# Patient Record
Sex: Female | Born: 1940 | Race: Black or African American | Hispanic: No | Smoking: Former smoker
Health system: Southern US, Community
[De-identification: ages and names within clinical notes are randomized; demographics above are authoritative.]

## PROBLEM LIST (undated history)

## (undated) DIAGNOSIS — M1990 Unspecified osteoarthritis, unspecified site: Secondary | ICD-10-CM

## (undated) DIAGNOSIS — R51 Headache: Secondary | ICD-10-CM

## (undated) DIAGNOSIS — C801 Malignant (primary) neoplasm, unspecified: Secondary | ICD-10-CM

## (undated) DIAGNOSIS — F419 Anxiety disorder, unspecified: Secondary | ICD-10-CM

## (undated) DIAGNOSIS — I10 Essential (primary) hypertension: Secondary | ICD-10-CM

## (undated) DIAGNOSIS — E785 Hyperlipidemia, unspecified: Secondary | ICD-10-CM

## (undated) DIAGNOSIS — L989 Disorder of the skin and subcutaneous tissue, unspecified: Secondary | ICD-10-CM

## (undated) DIAGNOSIS — E669 Obesity, unspecified: Secondary | ICD-10-CM

## (undated) DIAGNOSIS — M489 Spondylopathy, unspecified: Secondary | ICD-10-CM

## (undated) DIAGNOSIS — N189 Chronic kidney disease, unspecified: Secondary | ICD-10-CM

## (undated) HISTORY — DX: Chronic kidney disease, unspecified: N18.9

## (undated) HISTORY — DX: Hyperlipidemia, unspecified: E78.5

## (undated) HISTORY — DX: Spondylopathy, unspecified: M48.9

## (undated) HISTORY — DX: Essential (primary) hypertension: I10

## (undated) HISTORY — PX: ABDOMINAL HYSTERECTOMY: SHX81

## (undated) HISTORY — DX: Unspecified osteoarthritis, unspecified site: M19.90

## (undated) HISTORY — DX: Malignant (primary) neoplasm, unspecified: C80.1

## (undated) HISTORY — DX: Obesity, unspecified: E66.9

---

## 2003-11-15 ENCOUNTER — Encounter: Admission: RE | Admit: 2003-11-15 | Discharge: 2004-02-13 | Payer: Self-pay | Admitting: Endocrinology

## 2003-12-30 ENCOUNTER — Ambulatory Visit: Payer: Self-pay | Admitting: Endocrinology

## 2004-01-15 ENCOUNTER — Ambulatory Visit: Payer: Self-pay | Admitting: Pulmonary Disease

## 2004-01-15 ENCOUNTER — Ambulatory Visit (HOSPITAL_BASED_OUTPATIENT_CLINIC_OR_DEPARTMENT_OTHER): Admission: RE | Admit: 2004-01-15 | Discharge: 2004-01-15 | Payer: Self-pay | Admitting: Endocrinology

## 2004-01-26 ENCOUNTER — Ambulatory Visit: Payer: Self-pay | Admitting: Endocrinology

## 2004-02-03 ENCOUNTER — Ambulatory Visit: Payer: Self-pay | Admitting: Endocrinology

## 2004-02-06 ENCOUNTER — Ambulatory Visit: Payer: Self-pay | Admitting: Endocrinology

## 2004-03-07 ENCOUNTER — Ambulatory Visit: Payer: Self-pay | Admitting: Family Medicine

## 2004-03-16 ENCOUNTER — Ambulatory Visit: Payer: Self-pay | Admitting: Endocrinology

## 2004-04-18 ENCOUNTER — Ambulatory Visit: Payer: Self-pay | Admitting: Family Medicine

## 2004-04-19 ENCOUNTER — Ambulatory Visit: Payer: Self-pay | Admitting: *Deleted

## 2004-05-10 ENCOUNTER — Ambulatory Visit: Payer: Self-pay | Admitting: Family Medicine

## 2004-05-18 ENCOUNTER — Ambulatory Visit: Payer: Self-pay | Admitting: Family Medicine

## 2004-06-04 ENCOUNTER — Ambulatory Visit: Payer: Self-pay | Admitting: Family Medicine

## 2004-06-14 ENCOUNTER — Ambulatory Visit: Payer: Self-pay | Admitting: Internal Medicine

## 2004-06-21 ENCOUNTER — Ambulatory Visit: Payer: Self-pay | Admitting: Internal Medicine

## 2005-01-10 ENCOUNTER — Ambulatory Visit: Payer: Self-pay | Admitting: Family Medicine

## 2005-01-14 ENCOUNTER — Ambulatory Visit: Payer: Self-pay | Admitting: Family Medicine

## 2005-01-31 ENCOUNTER — Ambulatory Visit: Payer: Self-pay | Admitting: Family Medicine

## 2005-02-11 ENCOUNTER — Ambulatory Visit: Payer: Self-pay | Admitting: Family Medicine

## 2005-05-01 ENCOUNTER — Ambulatory Visit: Payer: Self-pay | Admitting: Family Medicine

## 2005-05-19 ENCOUNTER — Emergency Department (HOSPITAL_COMMUNITY): Admission: EM | Admit: 2005-05-19 | Discharge: 2005-05-19 | Payer: Self-pay | Admitting: Emergency Medicine

## 2005-05-21 ENCOUNTER — Emergency Department (HOSPITAL_COMMUNITY): Admission: EM | Admit: 2005-05-21 | Discharge: 2005-05-21 | Payer: Self-pay | Admitting: Family Medicine

## 2005-06-12 ENCOUNTER — Ambulatory Visit: Payer: Self-pay | Admitting: Family Medicine

## 2005-08-22 ENCOUNTER — Ambulatory Visit: Payer: Self-pay | Admitting: Family Medicine

## 2005-09-26 ENCOUNTER — Ambulatory Visit: Payer: Self-pay | Admitting: Family Medicine

## 2005-11-26 ENCOUNTER — Ambulatory Visit: Payer: Self-pay | Admitting: Family Medicine

## 2006-01-07 ENCOUNTER — Ambulatory Visit: Payer: Self-pay | Admitting: Family Medicine

## 2006-01-29 ENCOUNTER — Ambulatory Visit: Payer: Self-pay | Admitting: Family Medicine

## 2006-02-03 ENCOUNTER — Ambulatory Visit: Payer: Self-pay | Admitting: Internal Medicine

## 2006-04-03 ENCOUNTER — Ambulatory Visit: Payer: Self-pay | Admitting: Family Medicine

## 2006-04-10 ENCOUNTER — Ambulatory Visit (HOSPITAL_COMMUNITY): Admission: RE | Admit: 2006-04-10 | Discharge: 2006-04-10 | Payer: Self-pay | Admitting: Family Medicine

## 2006-04-10 ENCOUNTER — Ambulatory Visit: Payer: Self-pay | Admitting: Family Medicine

## 2006-09-09 ENCOUNTER — Encounter (INDEPENDENT_AMBULATORY_CARE_PROVIDER_SITE_OTHER): Payer: Self-pay | Admitting: Family Medicine

## 2006-09-09 ENCOUNTER — Ambulatory Visit: Payer: Self-pay | Admitting: Family Medicine

## 2006-09-09 LAB — CONVERTED CEMR LAB: Microalbumin U total vol: 26.7 mg/L

## 2006-11-03 ENCOUNTER — Encounter (INDEPENDENT_AMBULATORY_CARE_PROVIDER_SITE_OTHER): Payer: Self-pay | Admitting: Family Medicine

## 2006-11-03 DIAGNOSIS — E119 Type 2 diabetes mellitus without complications: Secondary | ICD-10-CM | POA: Insufficient documentation

## 2006-11-03 DIAGNOSIS — Z9079 Acquired absence of other genital organ(s): Secondary | ICD-10-CM | POA: Insufficient documentation

## 2006-11-03 DIAGNOSIS — Z9889 Other specified postprocedural states: Secondary | ICD-10-CM

## 2006-11-03 DIAGNOSIS — M1990 Unspecified osteoarthritis, unspecified site: Secondary | ICD-10-CM | POA: Insufficient documentation

## 2006-11-03 DIAGNOSIS — I10 Essential (primary) hypertension: Secondary | ICD-10-CM

## 2006-11-03 DIAGNOSIS — E1159 Type 2 diabetes mellitus with other circulatory complications: Secondary | ICD-10-CM | POA: Insufficient documentation

## 2006-12-17 ENCOUNTER — Encounter (INDEPENDENT_AMBULATORY_CARE_PROVIDER_SITE_OTHER): Payer: Self-pay | Admitting: *Deleted

## 2007-11-02 ENCOUNTER — Encounter: Admission: RE | Admit: 2007-11-02 | Discharge: 2007-11-02 | Payer: Self-pay | Admitting: General Practice

## 2010-08-17 NOTE — Procedures (Signed)
Bianca Barnett, Bianca Barnett                ACCOUNT NO.:  [id number]   MEDICAL RECORD NO.:  [id number]          PATIENT TYPE:  OUT   LOCATION:  SLEEP CENTER                 FACILITY:  [facility]   PHYSICIAN:  Marcelyn Bruins, M.D. [facility] DATE OF BIRTH:  Oct 04, 1940   DATE OF STUDY:  01/15/2004                              NOCTURNAL POLYSOMNOGRAM   REFERRING PHYSICIAN:  Dr. Romero Belling.   INDICATIONS FOR SURGERY:  Hypersomnia with sleep apnea.   SLEEP ARCHITECTURE:  The patient had a total sleep time of 295 minutes with  sleep efficiency of only 79%.  There was very little REM and decreased slow  wave sleep.  The patient had a fairly normal sleep onset latency and a very  prolonged REM latency at 331 minutes.   IMPRESSION:  1.  Moderate obstructive sleep apnea/hypopnea syndrome with a respiratory      disturbance index of 34 events per hour and oxygen desaturation as low      as 78%.  Events were not positional.  2.  Loud snoring noted throughout the study.  3.  Occasional premature ventricular contraction.  4.  Moderate numbers of leg jerks with mild sleep disruption.  Clinical      correlation is suggested if the patient does not respond adequately to      treatment of her obstructive sleep apnea.      KC/MEDQ  D:  02/01/2004 12:36:44  T:  02/01/2004 15:01:51  Job:  [id number]

## 2011-01-11 ENCOUNTER — Ambulatory Visit
Admission: RE | Admit: 2011-01-11 | Discharge: 2011-01-11 | Disposition: A | Discharge status: Home / Self Care | Payer: Medicare Other | Source: Ambulatory Visit | Attending: Family Medicine | Admitting: Family Medicine

## 2011-01-11 ENCOUNTER — Other Ambulatory Visit: Payer: Self-pay | Admitting: Family Medicine

## 2011-01-11 DIAGNOSIS — R220 Localized swelling, mass and lump, head: Secondary | ICD-10-CM

## 2011-01-11 DIAGNOSIS — R52 Pain, unspecified: Secondary | ICD-10-CM

## 2011-01-28 ENCOUNTER — Emergency Department (HOSPITAL_COMMUNITY): Payer: Medicare Other

## 2011-01-28 ENCOUNTER — Emergency Department (HOSPITAL_COMMUNITY)
Admission: EM | Admit: 2011-01-28 | Discharge: 2011-01-29 | Disposition: A | Discharge status: Home / Self Care | Payer: Medicare Other | Attending: Emergency Medicine | Admitting: Emergency Medicine

## 2011-01-28 DIAGNOSIS — R002 Palpitations: Secondary | ICD-10-CM | POA: Insufficient documentation

## 2011-01-28 DIAGNOSIS — R109 Unspecified abdominal pain: Secondary | ICD-10-CM | POA: Insufficient documentation

## 2011-01-28 DIAGNOSIS — R079 Chest pain, unspecified: Secondary | ICD-10-CM | POA: Insufficient documentation

## 2011-01-28 DIAGNOSIS — M5412 Radiculopathy, cervical region: Secondary | ICD-10-CM | POA: Insufficient documentation

## 2011-01-28 DIAGNOSIS — R0602 Shortness of breath: Secondary | ICD-10-CM | POA: Insufficient documentation

## 2011-01-28 DIAGNOSIS — E119 Type 2 diabetes mellitus without complications: Secondary | ICD-10-CM | POA: Insufficient documentation

## 2011-01-28 DIAGNOSIS — Z794 Long term (current) use of insulin: Secondary | ICD-10-CM | POA: Insufficient documentation

## 2011-01-28 DIAGNOSIS — I10 Essential (primary) hypertension: Secondary | ICD-10-CM | POA: Insufficient documentation

## 2011-01-28 DIAGNOSIS — Z79899 Other long term (current) drug therapy: Secondary | ICD-10-CM | POA: Insufficient documentation

## 2011-01-28 LAB — URINALYSIS, ROUTINE W REFLEX MICROSCOPIC
Bilirubin Urine: NEGATIVE
Leukocytes, UA: NEGATIVE
Nitrite: NEGATIVE
Specific Gravity, Urine: 1.016 (ref 1.005–1.030)
Urobilinogen, UA: 0.2 mg/dL (ref 0.0–1.0)

## 2011-01-28 LAB — COMPREHENSIVE METABOLIC PANEL
AST: 15 U/L (ref 0–37)
Albumin: 3.5 g/dL (ref 3.5–5.2)
Creatinine, Ser: 0.88 mg/dL (ref 0.50–1.10)
GFR calc non Af Amer: 66 mL/min — ABNORMAL LOW (ref >90)
Sodium: 136 mEq/L (ref 135–145)
Total Bilirubin: 0.1 mg/dL — ABNORMAL LOW (ref 0.3–1.2)
Total Protein: 7.7 g/dL (ref 6.0–8.3)

## 2011-01-28 LAB — URINE MICROSCOPIC-ADD ON

## 2011-01-28 LAB — GLUCOSE, CAPILLARY: Glucose-Capillary: 182 mg/dL — ABNORMAL HIGH (ref 70–99)

## 2011-01-28 LAB — DIFFERENTIAL
Basophils Absolute: 0 10*3/uL (ref 0.0–0.1)
Basophils Relative: 0 % (ref 0–1)
Eosinophils Relative: 1 % (ref 0–5)
Monocytes Absolute: 0.5 10*3/uL (ref 0.1–1.0)
Monocytes Relative: 5 % (ref 3–12)
Neutro Abs: 5.6 10*3/uL (ref 1.7–7.7)

## 2011-01-28 LAB — CBC
HCT: 38.7 % (ref 36.0–46.0)
Hemoglobin: 12.7 g/dL (ref 12.0–15.0)
MCH: 26.8 pg (ref 26.0–34.0)
MCV: 81.6 fL (ref 78.0–100.0)
Platelets: 263 10*3/uL (ref 150–400)
RBC: 4.74 MIL/uL (ref 3.87–5.11)
WBC: 10 10*3/uL (ref 4.0–10.5)

## 2011-01-28 LAB — POCT I-STAT TROPONIN I: Troponin i, poc: 0 ng/mL (ref 0.00–0.08)

## 2011-01-28 LAB — LIPASE, BLOOD: Lipase: 20 U/L (ref 11–59)

## 2011-01-30 LAB — URINE CULTURE
Colony Count: 8000
Culture  Setup Time: 201210300359

## 2011-03-07 ENCOUNTER — Encounter (INDEPENDENT_AMBULATORY_CARE_PROVIDER_SITE_OTHER): Payer: Self-pay | Admitting: Surgery

## 2011-03-07 ENCOUNTER — Ambulatory Visit (INDEPENDENT_AMBULATORY_CARE_PROVIDER_SITE_OTHER): Payer: Medicare Other | Admitting: Surgery

## 2011-03-07 DIAGNOSIS — R519 Headache, unspecified: Secondary | ICD-10-CM

## 2011-03-07 DIAGNOSIS — H539 Unspecified visual disturbance: Secondary | ICD-10-CM

## 2011-03-07 DIAGNOSIS — R51 Headache: Secondary | ICD-10-CM

## 2011-03-07 HISTORY — DX: Headache, unspecified: R51.9

## 2011-03-07 NOTE — Patient Instructions (Signed)
I need to call your MDs about the probable need for a biopsy by surgery to diagnose:  Temporal Arteritis Arteries are blood vessels that carry blood from the heart to all parts of the body. Temporal arteritis is a swelling (inflammation) of certain large arteries. This usually affects arteries in the head and neck area, including arteries in the area on the side of the head, between the ears and eyes (temples). The condition can be very painful. It also can cause serious problems, even blindness. Early diagnosis and treatment is very important. CAUSES  Temporal arteritis results from the body reacting to injury or infection (inflammation). This may occur when the body's immune system (which fights germs and disease) makes a mistake. It attacks its own arteries. No one knows why this happens. However, certain things (risk factors) make it more likely that a person will develop temporal arteritis. They include:  Age. Most people with temporal arteritis are older than 50. The average age is 85.   Sex. Three times more women than men develop the condition.   Race and ethnic background. Caucasians are more likely to have temporal arteritis than other races. So are people whose families came from [place]   Having polymyalgia rheumatica (PMR). This condition causes stiffness and pain in the joints of the neck, shoulders and hips. About 15% of people with PMR also have temporal arteritis.  SYMPTOMS  Not everyone with temporal arteritis has the same symptoms. Some people have just one symptom. Others may have several. The most common symptom is a new headache, often in the temple region. Symptoms may show up in other parts of the body too.   Symptoms affecting the head may include:   Temporal arteries that feel hard or swollen. It may hurt when the temples are touched.   Pain when combing your hair, or when laying your head on a pillow.   Pain in the  jaw when chewing.   Pain in the throat or tongue.   Visual problems, including sudden loss of vision in one eye, or seeing double.   Symptoms in other parts of the body may include:   Fever.   Fatigue.   A dry cough.   Pain in the hips and shoulders.   Pain in the arms during exercise.   Depression.   Weight loss.  DIAGNOSIS  Symptoms of temporal arteritis are similar to symptoms for other conditions. This can make it hard to tell if you have the condition. To be sure, your caregiver will ask about your symptoms and do a physical exam. Certain tests may be necessary, such as:   An exam of your temples. Often, the temporal arteries will be swollen and hard. This can be felt.   A complete blood count. This test shows how many red blood cells are in your blood. Most people with temporal arteritis do not have enough red blood cells (anemic).   Erythrocyte sedimentation (also called sed rate test). It measures inflammation in the body. Almost everyone with temporal arteritis has a high sed rate.   C reactive protein test. This also shows if there is inflammation.   Biopsy a temporal artery. This means the caregiver will take out a small piece of an artery. Then, it is checked under a microscope for inflammation. More than one biopsy may be needed. That is because inflammation can be in one part of an artery and not in others. Your caregiver may need to check  more than one spot.  TREATMENT  Starting treatment right away is very important. Often, you will need to see a specialist in immunologic diseases (rheumatologist). Goals of treatment include protecting your eyesight. Once vision is gone, it might not come back. The normal treatment is medication. It usually works well and quickly. Most people start getting better in a few days. Medication options include:  Corticosteroids. These are powerful drugs that fight inflammation. These drugs are most often used to treat temporal arteritis.    Usually, a high dose is taken at first. After symptoms improve, a smaller dose is used. The goal is to take the smallest dose possible and still control your symptoms. That is because using corticosteroids for a long time can cause problems. They can make muscles and bones weak. They can cause blood pressure to go up, and cause diabetes. Also, people often gain weight when they take corticosteroids. Corticosteroids may need to be taken for one or two years.   Several newer drugs are being tested to treat temporal arteritis. Researchers are testing to see if new drugs will work as well as corticosteroids, but cause fewer problems than them. Testing of these drugs is not yet complete.   Some specialists recommend low dose aspirin to prevent blood clots.  HOME CARE INSTRUCTIONS   Take any corticosteroids that your caregiver prescribes. Follow the directions carefully.   Take any vitamins or supplements that your caregiver suggests. This may include vitamin D and calcium. They help keep your bones from becoming weak.   Keep all appointments for checkups. Your caregiver will watch for any problems from the medication. Checkups may include:   Periodic blood tests.   Bone density testing. This checks how strong or weak your bones are.   Blood pressure checks. If your blood pressure rises, you may need to take a drug to control it while you are taking corticosteroids.   Blood sugar checks. This is to be sure you are not developing diabetes. If you have diabetes, corticosteroid medications may make it worse and require increased treatment.   Exercise. First, talk with your caregiver about what would be OK for you to do. Aerobic exercise (which increases your heart rate) is usually suggested. It does not have to require a lot of energy. Walking is aerobic exercise. This type of exercise is good because it helps prevent bone loss. It also helps control your blood pressure.   Follow a healthy diet.  The goal is to prevent bone damage and diabetes. Include good sources of protein in your diet. Also, include fruits, vegetables and whole grains. Your caregiver can refer you to an expert on healthy eating (dietitian) for more advice.  SEEK MEDICAL CARE IF:   The symptoms that lead to your diagnosis return.   You develop worsening fever, fatigue, headache, weight loss, or pain in your jaw.   You develop signs of infection. Infections can be worse if you are on corticosteroid medication.  SEEK IMMEDIATE MEDICAL CARE IF:   Your eyesight changes.   Pain does not go away, even after taking pain medicine.   You feel pain in your chest.   Breathing is difficult.   One side of your face or body suddenly becomes weak or numb.   You develop a fever of more than 102 F (38.9 C).  Document Released: 01/13/2009 Document Revised: 11/28/2010 Document Reviewed: 01/13/2009 [facility] Patient Information 2012 [place], [place].

## 2011-03-07 NOTE — Progress Notes (Signed)
Subjective:     Patient ID: Bianca Barnett, female   DOB: 03/27/1941, 70 y.o.   MRN: [id number]  HPI  Antinette J Ruzich  07/10/1940 [id number]  Patient Care Team: Cammie J Fulp as PCP - General (Family Medicine) Angela D Hawkes as Consulting Physician (Rheumatology) Jeffrey Kerr as Consulting Physician (Endocrinology)  This patient is a 70 y.o.female who presents today for surgical evaluation at the request of Dr. Hawkes.   Reason for visit: Need for a temporal artery biopsy to rule out arteritis.  Patient is a morbidly obese female with diabetes, hypertension, and other health issues. She recently established with the [facility] to help manage her care. She notes that her body seems to have fallen apart this past year. She notes increased feet and ankle swelling. She had difficulty with not seeing well, as if she was "looking for a fish tank" visual changes. She then had issues of bad headaches and left periorbital pain. She got more tired. Felt nauseated and vomiting.  She saw her primary care physician. She was given a trial of oral antibiotics. That seemed to improve things. She had an episode of chest pain that was concerning. She went to the emergency room. Workup ruled out any cardiopulmonary issues.  She had a CT scan of the head and neck. 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 However no major intracranial pathology. She was sent to rheumatology. She had more blood work done. Based on concerns with the workup otherwise being negative, she was sent to us to consider a temporal artery biopsy.  For some reason, she did not get in for several weeks. She was given a trial of prednisone when her symptoms worsened. ?She had an elevated CRP? She did a ten-day taper. She says she feels better, but she's not totally normal. Her glucose is up and poorly controlled. She is recently started on insulin and ramped up on that, challenging to control especially with the recent  prednisone.  She recalls having some surgery done at [facility] 9 years ago where they cut on her left scalp. She cannot tell me what it was or what they did for certain ago she noted she was awake and could see them doing things on the TV screen. She wondered if that was a temporal artery biopsy as well. Does not recall an aneurysm.  She's been to numerous other doctors as well.  Return to reach Dr. Hawkes office but they were closed. I will try again tomorrow. The patient comes today with her granddaughter who does not have further inside of the patient's history.  Patient Active Problem List  Diagnoses  . DIABETES MELLITUS, TYPE II  . MORBID OBESITY  . HYPERTENSION  . OSTEOARTHRITIS  . HYSTERECTOMY, HX OF  . ARTHROSCOPY, KNEE, HX OF  . New onset of headaches, left frontal  . Changes in vision, bluriness    Past Medical History  Diagnosis Date  . Arthritis   . Asthma   . Diabetes mellitus   . Hypertension   . Osteoporosis   . Cancer     ovarian    Past Surgical History  Procedure Date  . Temporal artery biopsy / ligation   . Abdominal hysterectomy     History   Social History  . Marital Status: Divorced    Spouse Name: N/A    Number of Children: N/A  . Years of Education: N/A   Occupational History  . Not on file.   Social   History Main Topics  . Smoking status: Former Smoker  . Smokeless tobacco: Not on file  . Alcohol Use: No  . Drug Use: No  . Sexually Active:    Other Topics Concern  . Not on file   Social History Narrative  . No narrative on file    Family History  Problem Relation Age of Onset  . Heart disease Mother   . Heart disease Father     Current outpatient prescriptions:amLODipine (NORVASC) 5 MG tablet, Take 5 mg by mouth daily.  , Disp: , Rfl: ;  clotrimazole-betamethasone (LOTRISONE) cream, Apply topically 2 (two) times daily.  , Disp: , Rfl: ;  furosemide (LASIX) 40 MG tablet, Take 40 mg by mouth daily.  , Disp: , Rfl: ;  hydrOXYzine  (ATARAX/VISTARIL) 25 MG tablet, Take 25 mg by mouth 3 (three) times daily as needed.  , Disp: , Rfl:  injection device for insulin (B-D PEN MINI) DEVI, by Other route once.  , Disp: , Rfl: ;  insulin glargine (LANTUS) 100 UNIT/ML injection, Inject 100 Units into the skin at bedtime.  , Disp: , Rfl: ;  potassium chloride (KLOR-CON) 10 MEQ CR tablet, Take 10 mEq by mouth daily.  , Disp: , Rfl:   Allergies  Allergen Reactions  . Penicillins     All of the cillins    BP 144/90  Pulse 88  Temp(Src) 97.6 F (36.4 C) (Temporal)  Resp 18  Ht 5' (1.524 m)  Wt 336 lb 9.6 oz (152.681 kg)  BMI 65.74 kg/m2     Review of Systems  Constitutional: Positive for fever, chills, activity change and fatigue. Negative for diaphoresis and appetite change.  HENT: Positive for congestion, trouble swallowing, neck pain and voice change. Negative for ear pain, sore throat and ear discharge.   Eyes: Positive for visual disturbance. Negative for photophobia, discharge and itching.  Respiratory: Positive for cough and wheezing. Negative for choking, chest tightness and shortness of breath.   Cardiovascular: Positive for leg swelling. Negative for chest pain and palpitations.  Gastrointestinal: Positive for nausea and vomiting. Negative for abdominal pain, diarrhea, constipation, anal bleeding and rectal pain.  Genitourinary: Negative for dysuria, frequency and difficulty urinating.  Musculoskeletal: Positive for myalgias, joint swelling and arthralgias. Negative for gait problem.  Skin: Positive for rash. Negative for color change and pallor.  Neurological: Positive for weakness and headaches. Negative for dizziness, speech difficulty and numbness.  Hematological: Negative for adenopathy.  Psychiatric/Behavioral: Negative for confusion and agitation. The patient is not nervous/anxious.        Objective:   Physical Exam  Constitutional: She is oriented to person, place, and time. She appears well-developed  and well-nourished. She is cooperative.  Non-toxic appearance. She does not have a sickly appearance. She does not appear ill. No distress.  HENT:  Head: Normocephalic. Head is without abrasion, without contusion, without right periorbital erythema and without left periorbital erythema.    Right Ear: Hearing and external ear normal. No drainage.  Left Ear: Hearing and external ear normal. No drainage.  Mouth/Throat: Oropharynx is clear and moist and mucous membranes are normal. Normal dentition. No oropharyngeal exudate, posterior oropharyngeal edema, posterior oropharyngeal erythema or tonsillar abscesses.  Eyes: Conjunctivae and EOM are normal. Pupils are equal, round, and reactive to light. No scleral icterus.  Neck: Normal range of motion. Neck supple. No tracheal deviation present.  Cardiovascular: Normal rate, regular rhythm and intact distal pulses.   Pulmonary/Chest: Effort normal and breath sounds normal. No respiratory   distress. She exhibits no tenderness.  Abdominal: Soft. She exhibits no distension and no mass. There is no tenderness. Hernia confirmed negative in the right inguinal area and confirmed negative in the left inguinal area.       Morbidly obese  Genitourinary: No vaginal discharge found.  Musculoskeletal: Normal range of motion. She exhibits no tenderness.  Lymphadenopathy:    She has no cervical adenopathy.       Right: No inguinal adenopathy present.       Left: No inguinal adenopathy present.  Neurological: She is alert and oriented to person, place, and time. No cranial nerve deficit. She exhibits normal muscle tone. Coordination normal.  Skin: Skin is warm and dry. No rash noted. She is not diaphoretic. No erythema.  Psychiatric: She has a normal mood and affect. Her behavior is normal. Judgment and thought content normal.       Recall fair, insight fair   Studies as above    Assessment:     Left frontal headaches, vision changes, and numerous other symptoms  of uncertain etiology.  CT scans underwhelming.    Plan:     I spent an awful lot of time trying to sort her out. She also went on tangents trying to explain other things as well. My records are limited.  My instinct says she would benefit from a biopsy for diagnosis. However, with her already receiving steroids, I do not know if that test will be as helpful. I would like to discuss it with Dr. Hawkes first. I do not know what his prior surgery at [facility] was done. The patient thinks that Dr. Hawkes or Dr. Fulp, her primary care physician, may have the records. Would be helpful to get. I feel a good pulse, so  I don't think they took her temporal artery out.  I tried to respectfully stress to the patient that it is her responsibility to help keep track of all these records. She kept wanting to say that so-and-so had them and just look up over there. I noted because she's been many different places, it is a challenge to try to organize things. Hopefully, her primary care physician and rheumatologist have been more successful at that than I.  I just want to be safe.  She wondered if I could be her primary doctor.  I noted that was not in her best interest to have a surgeon as the primary & to stick with the [facility] team, get all her records for them & herself, and give us time to sort it out.  The anatomy and the physiology was discussed. Pathophysiology and natural history of the disease was discussed. Options were discussed and recommendations were made for removal of part of the temporal artery for diagnosis.  Technique, risks, benefits, & alternatives were discussed. Questions answered.  The patient agrees to proceed.  I want to check with her Rheumatologist first.      

## 2011-03-08 ENCOUNTER — Telehealth (INDEPENDENT_AMBULATORY_CARE_PROVIDER_SITE_OTHER): Payer: Self-pay | Admitting: Surgery

## 2011-03-08 NOTE — Telephone Encounter (Signed)
D/w Dr. Nickola Major.  She requests to proceed w temporal artery Bx given elevated CRP & sed rate.  Unfortunately, she & PCP Dr. Pamala Hurry have had struggles gleaning Past Medical History w this patient as well.  Will proceed w surgery soon & forward path to her & Dr. Pamala Hurry.

## 2011-03-11 ENCOUNTER — Encounter (HOSPITAL_COMMUNITY): Payer: Self-pay | Admitting: *Deleted

## 2011-03-11 ENCOUNTER — Ambulatory Visit (INDEPENDENT_AMBULATORY_CARE_PROVIDER_SITE_OTHER): Payer: Self-pay | Admitting: General Surgery

## 2011-03-11 DIAGNOSIS — C801 Malignant (primary) neoplasm, unspecified: Secondary | ICD-10-CM

## 2011-03-11 DIAGNOSIS — L989 Disorder of the skin and subcutaneous tissue, unspecified: Secondary | ICD-10-CM

## 2011-03-11 DIAGNOSIS — M1990 Unspecified osteoarthritis, unspecified site: Secondary | ICD-10-CM

## 2011-03-11 DIAGNOSIS — F419 Anxiety disorder, unspecified: Secondary | ICD-10-CM

## 2011-03-11 HISTORY — PX: TEMPORAL ARTERY BIOPSY / LIGATION: SUR132

## 2011-03-11 HISTORY — DX: Disorder of the skin and subcutaneous tissue, unspecified: L98.9

## 2011-03-11 HISTORY — PX: SHOULDER ARTHROTOMY: SUR111

## 2011-03-11 HISTORY — DX: Malignant (primary) neoplasm, unspecified: C80.1

## 2011-03-11 HISTORY — PX: APPENDECTOMY: SHX54

## 2011-03-11 HISTORY — DX: Anxiety disorder, unspecified: F41.9

## 2011-03-11 HISTORY — PX: ANKLE RECONSTRUCTION: SHX1151

## 2011-03-11 HISTORY — DX: Unspecified osteoarthritis, unspecified site: M19.90

## 2011-03-11 HISTORY — PX: EYE SURGERY: SHX253

## 2011-03-11 HISTORY — PX: SKIN SURGERY: SHX2413

## 2011-03-11 HISTORY — PX: KNEE ARTHROSCOPY W/ DEBRIDEMENT: SHX1867

## 2011-03-12 ENCOUNTER — Encounter (HOSPITAL_COMMUNITY): Payer: Self-pay | Admitting: Pharmacy Technician

## 2011-03-13 ENCOUNTER — Encounter (HOSPITAL_COMMUNITY): Payer: Self-pay | Admitting: Anesthesiology

## 2011-03-13 ENCOUNTER — Ambulatory Visit (HOSPITAL_COMMUNITY): Payer: Medicare Other | Admitting: Anesthesiology

## 2011-03-13 ENCOUNTER — Encounter (HOSPITAL_COMMUNITY): Payer: Self-pay | Admitting: *Deleted

## 2011-03-13 ENCOUNTER — Ambulatory Visit (HOSPITAL_COMMUNITY)
Admission: RE | Admit: 2011-03-13 | Discharge: 2011-03-13 | Disposition: A | Discharge status: Home / Self Care | Payer: Medicare Other | Source: Ambulatory Visit | Attending: Surgery | Admitting: Surgery

## 2011-03-13 ENCOUNTER — Other Ambulatory Visit (INDEPENDENT_AMBULATORY_CARE_PROVIDER_SITE_OTHER): Payer: Self-pay | Admitting: Surgery

## 2011-03-13 ENCOUNTER — Encounter (HOSPITAL_COMMUNITY)
Admission: RE | Disposition: A | Discharge status: Home / Self Care | Payer: Self-pay | Source: Ambulatory Visit | Attending: Surgery

## 2011-03-13 DIAGNOSIS — R51 Headache: Secondary | ICD-10-CM

## 2011-03-13 DIAGNOSIS — M2550 Pain in unspecified joint: Secondary | ICD-10-CM | POA: Insufficient documentation

## 2011-03-13 DIAGNOSIS — H5710 Ocular pain, unspecified eye: Secondary | ICD-10-CM | POA: Insufficient documentation

## 2011-03-13 DIAGNOSIS — R509 Fever, unspecified: Secondary | ICD-10-CM | POA: Insufficient documentation

## 2011-03-13 DIAGNOSIS — M810 Age-related osteoporosis without current pathological fracture: Secondary | ICD-10-CM | POA: Insufficient documentation

## 2011-03-13 DIAGNOSIS — R112 Nausea with vomiting, unspecified: Secondary | ICD-10-CM | POA: Insufficient documentation

## 2011-03-13 DIAGNOSIS — H538 Other visual disturbances: Secondary | ICD-10-CM | POA: Insufficient documentation

## 2011-03-13 DIAGNOSIS — M7989 Other specified soft tissue disorders: Secondary | ICD-10-CM | POA: Insufficient documentation

## 2011-03-13 DIAGNOSIS — R498 Other voice and resonance disorders: Secondary | ICD-10-CM | POA: Insufficient documentation

## 2011-03-13 DIAGNOSIS — IMO0001 Reserved for inherently not codable concepts without codable children: Secondary | ICD-10-CM | POA: Insufficient documentation

## 2011-03-13 DIAGNOSIS — R05 Cough: Secondary | ICD-10-CM | POA: Insufficient documentation

## 2011-03-13 DIAGNOSIS — I771 Stricture of artery: Secondary | ICD-10-CM | POA: Insufficient documentation

## 2011-03-13 DIAGNOSIS — I10 Essential (primary) hypertension: Secondary | ICD-10-CM | POA: Insufficient documentation

## 2011-03-13 DIAGNOSIS — M542 Cervicalgia: Secondary | ICD-10-CM | POA: Insufficient documentation

## 2011-03-13 DIAGNOSIS — H539 Unspecified visual disturbance: Secondary | ICD-10-CM | POA: Insufficient documentation

## 2011-03-13 DIAGNOSIS — Z90710 Acquired absence of both cervix and uterus: Secondary | ICD-10-CM | POA: Insufficient documentation

## 2011-03-13 DIAGNOSIS — J45909 Unspecified asthma, uncomplicated: Secondary | ICD-10-CM | POA: Insufficient documentation

## 2011-03-13 DIAGNOSIS — Z79899 Other long term (current) drug therapy: Secondary | ICD-10-CM | POA: Insufficient documentation

## 2011-03-13 DIAGNOSIS — R059 Cough, unspecified: Secondary | ICD-10-CM | POA: Insufficient documentation

## 2011-03-13 DIAGNOSIS — M5030 Other cervical disc degeneration, unspecified cervical region: Secondary | ICD-10-CM | POA: Insufficient documentation

## 2011-03-13 DIAGNOSIS — G4730 Sleep apnea, unspecified: Secondary | ICD-10-CM | POA: Insufficient documentation

## 2011-03-13 DIAGNOSIS — F411 Generalized anxiety disorder: Secondary | ICD-10-CM | POA: Insufficient documentation

## 2011-03-13 DIAGNOSIS — E119 Type 2 diabetes mellitus without complications: Secondary | ICD-10-CM | POA: Insufficient documentation

## 2011-03-13 DIAGNOSIS — R1310 Dysphagia, unspecified: Secondary | ICD-10-CM | POA: Insufficient documentation

## 2011-03-13 DIAGNOSIS — R5381 Other malaise: Secondary | ICD-10-CM | POA: Insufficient documentation

## 2011-03-13 DIAGNOSIS — R700 Elevated erythrocyte sedimentation rate: Secondary | ICD-10-CM | POA: Insufficient documentation

## 2011-03-13 DIAGNOSIS — R21 Rash and other nonspecific skin eruption: Secondary | ICD-10-CM | POA: Insufficient documentation

## 2011-03-13 DIAGNOSIS — Z8543 Personal history of malignant neoplasm of ovary: Secondary | ICD-10-CM | POA: Insufficient documentation

## 2011-03-13 DIAGNOSIS — J3489 Other specified disorders of nose and nasal sinuses: Secondary | ICD-10-CM | POA: Insufficient documentation

## 2011-03-13 HISTORY — DX: Disorder of the skin and subcutaneous tissue, unspecified: L98.9

## 2011-03-13 HISTORY — PX: ARTERY BIOPSY: SHX891

## 2011-03-13 HISTORY — DX: Anxiety disorder, unspecified: F41.9

## 2011-03-13 LAB — GLUCOSE, CAPILLARY: Glucose-Capillary: 164 mg/dL — ABNORMAL HIGH (ref 70–99)

## 2011-03-13 LAB — BASIC METABOLIC PANEL
BUN: 18 mg/dL (ref 6–23)
Chloride: 102 mEq/L (ref 96–112)
Creatinine, Ser: 0.91 mg/dL (ref 0.50–1.10)
GFR calc Af Amer: 73 mL/min — ABNORMAL LOW (ref >90)
Glucose, Bld: 150 mg/dL — ABNORMAL HIGH (ref 70–99)
Potassium: 3.8 mEq/L (ref 3.5–5.1)

## 2011-03-13 LAB — CBC
HCT: 37.6 % (ref 36.0–46.0)
Hemoglobin: 12.4 g/dL (ref 12.0–15.0)
MCV: 81.6 fL (ref 78.0–100.0)
RDW: 14.7 % (ref 11.5–15.5)
WBC: 7.6 10*3/uL (ref 4.0–10.5)

## 2011-03-13 SURGERY — BIOPSY TEMPORAL ARTERY
Anesthesia: Monitor Anesthesia Care | Site: Face | Laterality: Left | Wound class: Clean

## 2011-03-13 MED ORDER — SODIUM CHLORIDE 0.9 % IJ SOLN: 3.0000 mL | INTRAMUSCULAR | Status: DC | PRN

## 2011-03-13 MED ORDER — ACETAMINOPHEN 325 MG PO TABS
ORAL_TABLET | ORAL | Status: AC
  Filled 2011-03-13: qty 2

## 2011-03-13 MED ORDER — FENTANYL CITRATE 0.05 MG/ML IJ SOLN
INTRAMUSCULAR | Status: DC | PRN
  Administered 2011-03-13 (×2): 100 ug via INTRAVENOUS
  Administered 2011-03-13: 50 ug via INTRAVENOUS

## 2011-03-13 MED ORDER — BUPIVACAINE-EPINEPHRINE 0.25% -1:200000 IJ SOLN
INTRAMUSCULAR | Status: DC | PRN
  Administered 2011-03-13: 20 mL

## 2011-03-13 MED ORDER — FENTANYL CITRATE 0.05 MG/ML IJ SOLN: 25.0000 ug | INTRAMUSCULAR | Status: DC | PRN

## 2011-03-13 MED ORDER — BUPIVACAINE-EPINEPHRINE PF 0.25-1:200000 % IJ SOLN
INTRAMUSCULAR | Status: AC
  Filled 2011-03-13: qty 30

## 2011-03-13 MED ORDER — SUCCINYLCHOLINE CHLORIDE 20 MG/ML IJ SOLN
INTRAMUSCULAR | Status: DC | PRN
  Administered 2011-03-13: 140 mg via INTRAVENOUS

## 2011-03-13 MED ORDER — ACETAMINOPHEN 325 MG PO TABS
650.0000 mg | ORAL_TABLET | ORAL | Status: DC | PRN
  Administered 2011-03-13: 650 mg via ORAL

## 2011-03-13 MED ORDER — SODIUM CHLORIDE 0.9 % IJ SOLN: 3.0000 mL | Freq: Two times a day (BID) | INTRAMUSCULAR | Status: DC

## 2011-03-13 MED ORDER — SODIUM CHLORIDE 0.9 % IV SOLN: 250.0000 mL | INTRAVENOUS | Status: DC | PRN

## 2011-03-13 MED ORDER — INSULIN GLARGINE 100 UNIT/ML ~~LOC~~ SOLN: 35.0000 [IU] | Freq: Every day | SUBCUTANEOUS | Status: DC

## 2011-03-13 MED ORDER — MUPIROCIN 2 % EX OINT
TOPICAL_OINTMENT | Freq: Two times a day (BID) | CUTANEOUS | Status: DC
  Filled 2011-03-13: qty 22

## 2011-03-13 MED ORDER — AMLODIPINE BESYLATE 5 MG PO TABS
5.0000 mg | ORAL_TABLET | ORAL | Status: DC
  Filled 2011-03-13 (×2): qty 1

## 2011-03-13 MED ORDER — MIDAZOLAM HCL 5 MG/5ML IJ SOLN
INTRAMUSCULAR | Status: DC | PRN
  Administered 2011-03-13: 2 mg via INTRAVENOUS

## 2011-03-13 MED ORDER — OXYCODONE HCL 5 MG PO TABS: 5.0000 mg | ORAL_TABLET | Freq: Four times a day (QID) | ORAL | Status: AC | PRN

## 2011-03-13 MED ORDER — DEXAMETHASONE SODIUM PHOSPHATE 10 MG/ML IJ SOLN
INTRAMUSCULAR | Status: DC | PRN
  Administered 2011-03-13: 10 mg via INTRAVENOUS

## 2011-03-13 MED ORDER — ONDANSETRON HCL 4 MG/2ML IJ SOLN
INTRAMUSCULAR | Status: DC | PRN
  Administered 2011-03-13: 4 mg via INTRAVENOUS

## 2011-03-13 MED ORDER — OXYCODONE HCL 5 MG PO TABS: 5.0000 mg | ORAL_TABLET | ORAL | Status: DC | PRN

## 2011-03-13 MED ORDER — LACTATED RINGERS IV SOLN
INTRAVENOUS | Status: DC | PRN
  Administered 2011-03-13 (×2): via INTRAVENOUS

## 2011-03-13 MED ORDER — INJECTION DEVICE FOR INSULIN DEVI: Freq: Once | Status: DC

## 2011-03-13 MED ORDER — LACTATED RINGERS IV SOLN
INTRAVENOUS | Status: DC
  Administered 2011-03-13: 1000 mL via INTRAVENOUS

## 2011-03-13 MED ORDER — FUROSEMIDE 40 MG PO TABS
40.0000 mg | ORAL_TABLET | ORAL | Status: DC
  Filled 2011-03-13 (×2): qty 1

## 2011-03-13 MED ORDER — ONDANSETRON HCL 4 MG/2ML IJ SOLN: 4.0000 mg | Freq: Four times a day (QID) | INTRAMUSCULAR | Status: DC | PRN

## 2011-03-13 MED ORDER — PROMETHAZINE HCL 25 MG/ML IJ SOLN: 12.5000 mg | Freq: Four times a day (QID) | INTRAMUSCULAR | Status: DC | PRN

## 2011-03-13 MED ORDER — BUPIVACAINE-EPINEPHRINE PF 0.25-1:200000 % IJ SOLN
INTRAMUSCULAR | Status: AC
Start: 2011-03-13 — End: 2011-03-13
  Filled 2011-03-13: qty 30

## 2011-03-13 MED ORDER — MUPIROCIN CALCIUM 2 % NA OINT
1.0000 "application " | TOPICAL_OINTMENT | Freq: Two times a day (BID) | NASAL | Status: DC
  Filled 2011-03-13 (×7): qty 1

## 2011-03-13 MED ORDER — PROPOFOL 10 MG/ML IV EMUL
INTRAVENOUS | Status: DC | PRN
  Administered 2011-03-13: 300 mg via INTRAVENOUS

## 2011-03-13 MED ORDER — BUPIVACAINE HCL 0.25 % IJ SOLN
INTRAMUSCULAR | Status: AC
  Filled 2011-03-13: qty 1

## 2011-03-13 MED ORDER — ACETAMINOPHEN 650 MG RE SUPP
650.0000 mg | RECTAL | Status: DC | PRN
  Filled 2011-03-13: qty 1

## 2011-03-13 SURGICAL SUPPLY — 54 items
ADH SKN CLS APL DERMABOND .7 (GAUZE/BANDAGES/DRESSINGS)
APL SKNCLS STERI-STRIP NONHPOA (GAUZE/BANDAGES/DRESSINGS)
BENZOIN TINCTURE PRP APPL 2/3 (GAUZE/BANDAGES/DRESSINGS) IMPLANT
BLADE HEX COATED 2.75 (ELECTRODE) ×2 IMPLANT
BLADE SURG 15 STRL LF DISP TIS (BLADE) ×1 IMPLANT
BLADE SURG 15 STRL SS (BLADE) ×2
CLIP TI WIDE RED SMALL 6 (CLIP) ×6 IMPLANT
CLOSURE STERI STRIP 1/2 X4 (GAUZE/BANDAGES/DRESSINGS) ×1 IMPLANT
CLOTH BEACON ORANGE TIMEOUT ST (SAFETY) ×2 IMPLANT
CONT SPECI 4OZ STER CLIK (MISCELLANEOUS) ×2 IMPLANT
DERMABOND ADVANCED (GAUZE/BANDAGES/DRESSINGS)
DERMABOND ADVANCED .7 DNX12 (GAUZE/BANDAGES/DRESSINGS) IMPLANT
DISSECTOR ROUND CHERRY 3/8 STR (MISCELLANEOUS) ×2 IMPLANT
DRAPE PED LAPAROTOMY (DRAPES) ×2 IMPLANT
DRSG TEGADERM 2-3/8X2-3/4 SM (GAUZE/BANDAGES/DRESSINGS) ×2 IMPLANT
ELECT REM PT RETURN 9FT ADLT (ELECTROSURGICAL) ×2
ELECTRODE REM PT RTRN 9FT ADLT (ELECTROSURGICAL) ×1 IMPLANT
GAUZE SPONGE 4X4 12PLY STRL LF (GAUZE/BANDAGES/DRESSINGS) ×2 IMPLANT
GAUZE SPONGE 4X4 16PLY XRAY LF (GAUZE/BANDAGES/DRESSINGS) ×2 IMPLANT
GLOVE BIOGEL PI IND STRL 7.0 (GLOVE) ×1 IMPLANT
GLOVE BIOGEL PI INDICATOR 7.0 (GLOVE) ×1
GLOVE ECLIPSE 8.0 STRL XLNG CF (GLOVE) ×2 IMPLANT
GLOVE INDICATOR 8.0 STRL GRN (GLOVE) ×2 IMPLANT
GOWN STRL NON-REIN LRG LVL3 (GOWN DISPOSABLE) ×2 IMPLANT
GOWN STRL REIN XL XLG (GOWN DISPOSABLE) ×4 IMPLANT
HEMOSTAT SURGICEL 2X14 (HEMOSTASIS) IMPLANT
KIT BASIN OR (CUSTOM PROCEDURE TRAY) ×2 IMPLANT
LUBRICANT JELLY ST 5GR 8946 (MISCELLANEOUS) ×4 IMPLANT
NDL HYPO 25X1 1.5 SAFETY (NEEDLE) ×1 IMPLANT
NEEDLE HYPO 25X1 1.5 SAFETY (NEEDLE) ×2 IMPLANT
NS IRRIG 1000ML POUR BTL (IV SOLUTION) ×2 IMPLANT
PACK BASIC VI WITH GOWN DISP (CUSTOM PROCEDURE TRAY) ×2 IMPLANT
PEN SKIN MARKING BROAD (MISCELLANEOUS) ×2 IMPLANT
PENCIL BUTTON HOLSTER BLD 10FT (ELECTRODE) ×2 IMPLANT
SPONGE GAUZE 4X4 12PLY (GAUZE/BANDAGES/DRESSINGS) IMPLANT
STRIP CLOSURE SKIN 1/2X4 (GAUZE/BANDAGES/DRESSINGS) ×2 IMPLANT
SUT MNCRL AB 4-0 PS2 18 (SUTURE) ×2 IMPLANT
SUT MON AB 3-0 SH 27 (SUTURE)
SUT MON AB 3-0 SH27 (SUTURE) IMPLANT
SUT PROLENE 4 0 RB 1 (SUTURE) ×2
SUT PROLENE 4-0 RB1 .5 CRCL 36 (SUTURE) IMPLANT
SUT SILK 2 0 (SUTURE)
SUT SILK 2-0 18XBRD TIE 12 (SUTURE) IMPLANT
SUT SILK 3 0 (SUTURE) ×2
SUT SILK 3-0 18XBRD TIE 12 (SUTURE) ×1 IMPLANT
SUT VIC AB 3-0 SH 27 (SUTURE)
SUT VIC AB 3-0 SH 27XBRD (SUTURE) IMPLANT
SUT VIC AB 3-0 SH 8-18 (SUTURE) ×2 IMPLANT
SUT VIC AB 4-0 PS2 27 (SUTURE) IMPLANT
SYR BULB IRRIGATION 50ML (SYRINGE) IMPLANT
SYR CONTROL 10ML LL (SYRINGE) ×2 IMPLANT
TAPE PAPER MEDFIX 1IN X 10YD (GAUZE/BANDAGES/DRESSINGS) ×2 IMPLANT
TOWEL OR 17X26 10 PK STRL BLUE (TOWEL DISPOSABLE) ×2 IMPLANT
YANKAUER SUCT BULB TIP 10FT TU (MISCELLANEOUS) ×2 IMPLANT

## 2011-03-13 NOTE — Anesthesia Preprocedure Evaluation (Addendum)
Anesthesia Evaluation  Patient identified by MRN, date of birth, ID band Patient awake    Reviewed: Allergy & Precautions, H&P , NPO status , Patient's Chart, lab work & pertinent test results  Airway Mallampati: III TM Distance: >3 FB Neck ROM: Full    Dental No notable dental hx.    Pulmonary neg pulmonary ROS, asthma ,  clear to auscultation  Pulmonary exam normal + decreased breath sounds      Cardiovascular hypertension, neg cardio ROS Regular Normal    Neuro/Psych Negative Neurological ROS  Negative Psych ROS   GI/Hepatic negative GI ROS, Neg liver ROS,   Endo/Other  Negative Endocrine ROSDiabetes mellitus-, Type 1, Insulin DependentMorbid obesity  Renal/GU negative Renal ROS  Genitourinary negative   Musculoskeletal negative musculoskeletal ROS (+)   Abdominal   Peds negative pediatric ROS (+)  Hematology negative hematology ROS (+)   Anesthesia Other Findings   Reproductive/Obstetrics negative OB ROS                           Anesthesia Physical Anesthesia Plan  ASA: III  Anesthesia Plan: General   Post-op Pain Management:    Induction: Intravenous  Airway Management Planned: Oral ETT  Additional Equipment:   Intra-op Plan:   Post-operative Plan:   Informed Consent: I have reviewed the patients History and Physical, chart, labs and discussed the procedure including the risks, benefits and alternatives for the proposed anesthesia with the patient or authorized representative who has indicated his/her understanding and acceptance.   Dental advisory given  Plan Discussed with: CRNA  Anesthesia Plan Comments:        Anesthesia Quick Evaluation

## 2011-03-13 NOTE — Op Note (Signed)
Bianca Barnett, ZUCH NO.:  [id number]  MEDICAL RECORD NO.:  [id number]  LOCATION:  [facility]                         FACILITY:  [facility]  PHYSICIAN:  Ardeth Sportsman, MD     DATE OF BIRTH:  May 06, 1940  DATE OF PROCEDURE:  03/13/2011 DATE OF DISCHARGE:                              OPERATIVE REPORT   PRIMARY CARE PHYSICIAN:  Cain Saupe, MD.  RHEUMATOLOGIST:  Zenovia Jordan, MD.  SURGEON:  Ardeth Sportsman, MD  ASSISTANT:  R.N.  PREOPERATIVE DIAGNOSIS:  Left-sided frontal headaches with vision changes and elevated sed rate, concern for temporal giant cell arteritis.  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  PROCEDURE PERFORMED:  Excisional biopsy of left temporal artery x3 cm.  ANESTHESIA: 1. General anesthesia. 2. Local anesthetic and a field block.  SPECIMENS:  Left temporal artery.  DRAINS:  None.  ESTIMATED BLOOD LOSS:  Minimal.  COMPLICATIONS:  None apparent.  INDICATIONS:  Ms. Bedwell is a 70 year old morbidly obese female with numerous comorbidities who was found to have recent episodes of left- sided periorbital headaches as well some vision changes.  She had elevated sed rate and C-reactive protein.  Workup by Dr. Cain Saupe, her primary care physician as well as Dr. Zenovia Jordan, Rheumatology. Based on concerns, she was placed on steroids for a brief period while trying to work in and get a temporal artery biopsy.  She had improvement in symptoms, but based on concerns recommendation made for excisional biopsy.  Technique of excisional biopsy of temporal artery was discussed.  Risks, benefits, alternatives discussed.  Questions answered and she agreed to proceed.  OPERATIVE FINDINGS:  She had a normal-appearing mildly tortuous temporal artery.  It was sent saline to Pathology.  DESCRIPTION OF PROCEDURE:  Informed consent was confirmed.   Because of her morbid obesity and sleep apnea and anxiety issues, she was placed under general anesthesia with endotracheal intubation.  She did have 2 episodes of emesis of bilious fluid after intubation prior to surgery starting,  She was aspirated and suctioned, but had no desaturation. Her left face and ear were prepped and draped in a sterile fashion. Surgical time-out confirmed our plan.  I could easily palpate a pulse anterior to the left ear and for the anti- tragus.  I did a vertical incision about a half centimeter away from the left ear.  I went through the subcutaneous tissues and through the fascia and found an obvious vein and artery.  I could easily feel the pulse consistent with a pulsatile temporal artery.  I ended up having to extend the incision to about 4 cm given she had some thickness there.  I was able to bluntly get around the temporal artery, gotten off of the vein.  I did have bleeding of the branch off the temporal artery, but I closed it with some clips.  I skeletonized the temporal artery taking small clips on small side branches off the temporal artery.  I followed it into the foramen near the antitragus and tortured a little more superiorly and anterosuperiorly.  After I had a good 3 cm  of length,  I went ahead and placed clamps of both ends and transected them off with a scalpel .  Per discussion with pathology, we sent it in saline immediately for them to process.  Hemostasis was excellent.  I did a suture ligature, which 4-0 Prolene suture ligature at the proximal and distal ends and placed 2 clips on each end as well for good hemostasis.  I closed the wound using 3-0 Vicryl interrupted deep dermal stitches and running 4-0 Monocryl stitch, and Steri-Strips.  She had gauze and paper tape given her allergies to numerous other adhesives.  She was extubated and should be able to leave home later today. Discussed postop instructions with her including  followup with me in a few weeks and to call Dr. Nickola Major for pathology results.  We will help facilitate that when we find that as well.     Ardeth Sportsman, MD     SCG/MEDQ  D:  03/13/2011  T:  03/13/2011  Job:  [id number]  cc:   Cain Saupe, MD Fax: [phone]  Zenovia Jordan, MD Fax: [phone]

## 2011-03-13 NOTE — Progress Notes (Signed)
Labs drawn nasal swab completed

## 2011-03-13 NOTE — Brief Op Note (Signed)
03/13/2011  1:43 PM  PATIENT:  Bianca Barnett  70 y.o. female  Patient Care Team: Cammie Katheran James as PCP - General (Family Medicine) Obie Dredge as Consulting Physician (Rheumatology) Talmage Coin as Consulting Physician (Endocrinology)  PRE-OPERATIVE DIAGNOSIS:  Headaches, possible temporal arteritis   POST-OPERATIVE DIAGNOSIS:  Headaches, possible temporal arteritis  PROCEDURE:  Procedure(s): BIOPSY TEMPORAL ARTERY  SURGEON:  Surgeon(s): Ardeth Sportsman, MD  PHYSICIAN ASSISTANT:   ASSISTANTS: none   ANESTHESIA:   local and general  EBL:  Total I/O In: 1000 [I.V.:1000] Out: -   BLOOD ADMINISTERED:none  DRAINS: none   LOCAL MEDICATIONS USED:  BUPIVICAINE 20CC  SPECIMEN:  Source of Specimen:  Left temporal artery (sent to pathology in [place])  DISPOSITION OF SPECIMEN:  PATHOLOGY  COUNTS:  YES  TOURNIQUET:  * No tourniquets in log *  DICTATION: .Other Dictation: Dictation Number [id number]  PLAN OF CARE: Discharge to home after PACU  PATIENT DISPOSITION:  PACU - hemodynamically stable.   Delay start of Pharmacological VTE agent (>24hrs) due to surgical blood loss or risk of bleeding:  NOT APPLICABLE

## 2011-03-13 NOTE — Interval H&P Note (Signed)
History and Physical Interval Note:  03/13/2011 12:05 PM  Bianca Barnett  has presented today for surgery, with the diagnosis of headaches possible temporal arteritis   The various methods of treatment have been discussed with the patient and family. After consideration of risks, benefits and other options for treatment, the patient has consented to  Procedure(s): BIOPSY TEMPORAL ARTERY as a surgical intervention .  The patients' history has been reviewed, patient examined, no change in status, stable for surgery.  I have reviewed the patients' chart and labs.  Questions were answered to the patient's satisfaction.     Gerold Sar C.

## 2011-03-13 NOTE — Transfer of Care (Signed)
Immediate Anesthesia Transfer of Care Note  Patient: Bianca Barnett  Procedure(s) Performed:  BIOPSY TEMPORAL ARTERY - left temporal artery biopsy with doppler   Patient Location: PACU  Anesthesia Type: General  Level of Consciousness: awake and alert   Airway & Oxygen Therapy: Patient Spontanous Breathing  Post-op Assessment: Report given to PACU RN  Post vital signs: Reviewed and stable  Complications: No apparent anesthesia complications

## 2011-03-13 NOTE — H&P (View-Only) (Signed)
Subjective:     Patient ID: Bianca Barnett, female   DOB: 25-May-1940, 70 y.o.   MRN: [id number]  HPI  Bianca Barnett  Dec 14, 1940 [id number]  Patient Care Team: Candi Leash as PCP - General (Family Medicine) Obie Dredge as Consulting Physician (Rheumatology) Talmage Coin as Consulting Physician (Endocrinology)  This patient is a 70 y.o.female who presents today for surgical evaluation at the request of Dr. Nickola Major.   Reason for visit: Need for a temporal artery biopsy to rule out arteritis.  Patient is a morbidly obese female with diabetes, hypertension, and other health issues. She recently established with the [facility] physicians to help manage her care. She notes that her body seems to have fallen apart this past year. She notes increased feet and ankle swelling. She had difficulty with not seeing well, as if she was "looking for a fish tank" visual changes. She then had issues of bad headaches and left periorbital pain. She got more tired. Felt nauseated and vomiting.  She saw her primary care physician. She was given a trial of oral antibiotics. That seemed to improve things. She had an episode of chest pain that was concerning. She went to the emergency room. Workup ruled out any cardiopulmonary issues.  She had a CT scan of the head and neck. 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 However no major intracranial pathology. She was sent to rheumatology. She had more blood work done. Based on concerns with the workup otherwise being negative, she was sent to [place] to consider a temporal artery biopsy.  For some reason, she did not get in for several weeks. She was given a trial of prednisone when her symptoms worsened. ?She had an elevated CRP? She did a ten-day taper. She says she feels better, but she's not totally normal. Her glucose is up and poorly controlled. She is recently started on insulin and ramped up on that, challenging to control especially with the recent  prednisone.  She recalls having some surgery done at [facility] 9 years ago where they cut on her left scalp. She cannot tell me what it was or what they did for certain ago she noted she was awake and could see them doing things on the TV screen. She wondered if that was a temporal artery biopsy as well. Does not recall an aneurysm.  She's been to numerous other doctors as well.  Return to reach Dr. Nickola Major office but they were closed. I will try again tomorrow. The patient comes today with her granddaughter who does not have further inside of the patient's history.  Patient Active Problem List  Diagnoses  . DIABETES MELLITUS, TYPE II  . MORBID OBESITY  . HYPERTENSION  . OSTEOARTHRITIS  . HYSTERECTOMY, HX OF  . ARTHROSCOPY, KNEE, HX OF  . New onset of headaches, left frontal  . Changes in vision, bluriness    Past Medical History  Diagnosis Date  . Arthritis   . Asthma   . Diabetes mellitus   . Hypertension   . Osteoporosis   . Cancer     ovarian    Past Surgical History  Procedure Date  . Temporal artery biopsy / ligation   . Abdominal hysterectomy     History   Social History  . Marital Status: Divorced    Spouse Name: N/A    Number of Children: N/A  . Years of Education: N/A   Occupational History  . Not on file.   Social  History Main Topics  . Smoking status: Former Games developer  . Smokeless tobacco: Not on file  . Alcohol Use: No  . Drug Use: No  . Sexually Active:    Other Topics Concern  . Not on file   Social History Narrative  . No narrative on file    Family History  Problem Relation Age of Onset  . Heart disease Mother   . Heart disease Father     Current outpatient prescriptions:amLODipine (NORVASC) 5 MG tablet, Take 5 mg by mouth daily.  , Disp: , Rfl: ;  clotrimazole-betamethasone (LOTRISONE) cream, Apply topically 2 (two) times daily.  , Disp: , Rfl: ;  furosemide (LASIX) 40 MG tablet, Take 40 mg by mouth daily.  , Disp: , Rfl: ;  hydrOXYzine  (ATARAX/VISTARIL) 25 MG tablet, Take 25 mg by mouth 3 (three) times daily as needed.  , Disp: , Rfl:  injection device for insulin (B-D PEN MINI) DEVI, by Other route once.  , Disp: , Rfl: ;  insulin glargine (LANTUS) 100 UNIT/ML injection, Inject 100 Units into the skin at bedtime.  , Disp: , Rfl: ;  potassium chloride (KLOR-CON) 10 MEQ CR tablet, Take 10 mEq by mouth daily.  , Disp: , Rfl:   Allergies  Allergen Reactions  . Penicillins     All of the cillins    BP 144/90  Pulse 88  Temp(Src) 97.6 F (36.4 C) (Temporal)  Resp 18  Ht 5' (1.524 m)  Wt 336 lb 9.6 oz (152.681 kg)  BMI 65.74 kg/m2     Review of Systems  Constitutional: Positive for fever, chills, activity change and fatigue. Negative for diaphoresis and appetite change.  HENT: Positive for congestion, trouble swallowing, neck pain and voice change. Negative for ear pain, sore throat and ear discharge.   Eyes: Positive for visual disturbance. Negative for photophobia, discharge and itching.  Respiratory: Positive for cough and wheezing. Negative for choking, chest tightness and shortness of breath.   Cardiovascular: Positive for leg swelling. Negative for chest pain and palpitations.  Gastrointestinal: Positive for nausea and vomiting. Negative for abdominal pain, diarrhea, constipation, anal bleeding and rectal pain.  Genitourinary: Negative for dysuria, frequency and difficulty urinating.  Musculoskeletal: Positive for myalgias, joint swelling and arthralgias. Negative for gait problem.  Skin: Positive for rash. Negative for color change and pallor.  Neurological: Positive for weakness and headaches. Negative for dizziness, speech difficulty and numbness.  Hematological: Negative for adenopathy.  Psychiatric/Behavioral: Negative for confusion and agitation. The patient is not nervous/anxious.        Objective:   Physical Exam  Constitutional: She is oriented to person, place, and time. She appears well-developed  and well-nourished. She is cooperative.  Non-toxic appearance. She does not have a sickly appearance. She does not appear ill. No distress.  HENT:  Head: Normocephalic. Head is without abrasion, without contusion, without right periorbital erythema and without left periorbital erythema.    Right Ear: Hearing and external ear normal. No drainage.  Left Ear: Hearing and external ear normal. No drainage.  Mouth/Throat: Oropharynx is clear and moist and mucous membranes are normal. Normal dentition. No oropharyngeal exudate, posterior oropharyngeal edema, posterior oropharyngeal erythema or tonsillar abscesses.  Eyes: Conjunctivae and EOM are normal. Pupils are equal, round, and reactive to light. No scleral icterus.  Neck: Normal range of motion. Neck supple. No tracheal deviation present.  Cardiovascular: Normal rate, regular rhythm and intact distal pulses.   Pulmonary/Chest: Effort normal and breath sounds normal. No respiratory  distress. She exhibits no tenderness.  Abdominal: Soft. She exhibits no distension and no mass. There is no tenderness. Hernia confirmed negative in the right inguinal area and confirmed negative in the left inguinal area.       Morbidly obese  Genitourinary: No vaginal discharge found.  Musculoskeletal: Normal range of motion. She exhibits no tenderness.  Lymphadenopathy:    She has no cervical adenopathy.       Right: No inguinal adenopathy present.       Left: No inguinal adenopathy present.  Neurological: She is alert and oriented to person, place, and time. No cranial nerve deficit. She exhibits normal muscle tone. Coordination normal.  Skin: Skin is warm and dry. No rash noted. She is not diaphoretic. No erythema.  Psychiatric: She has a normal mood and affect. Her behavior is normal. Judgment and thought content normal.       Recall fair, insight fair   Studies as above    Assessment:     Left frontal headaches, vision changes, and numerous other symptoms  of uncertain etiology.  CT scans underwhelming.    Plan:     I spent an awful lot of time trying to sort her out. She also went on tangents trying to explain other things as well. My records are limited.  My instinct says she would benefit from a biopsy for diagnosis. However, with her already receiving steroids, I do not know if that test will be as helpful. I would like to discuss it with Dr. Nickola Major first. I do not know what his prior surgery at [facility] was done. The patient thinks that Dr. Nickola Major or Dr. Jillyn Hidden, her primary care physician, may have the records. Would be helpful to get. I feel a good pulse, so  I don't think they took her temporal artery out.  I tried to respectfully stress to the patient that it is her responsibility to help keep track of all these records. She kept wanting to say that so-and-so had them and just look up over there. I noted because she's been many different places, it is a challenge to try to organize things. Hopefully, her primary care physician and rheumatologist have been more successful at that than I.  I just want to be safe.  She wondered if I could be her primary doctor.  I noted that was not in her best interest to have a surgeon as the primary & to stick with the [facility] team, get all her records for them & herself, and give [place] time to sort it out.  The anatomy and the physiology was discussed. Pathophysiology and natural history of the disease was discussed. Options were discussed and recommendations were made for removal of part of the temporal artery for diagnosis.  Technique, risks, benefits, & alternatives were discussed. Questions answered.  The patient agrees to proceed.  I want to check with her Rheumatologist first.

## 2011-03-13 NOTE — Anesthesia Procedure Notes (Signed)
Procedure Name: Intubation Date/Time: 03/13/2011 12:37 PM Performed by: [place], Malayla Granberry C Pre-anesthesia Checklist: Patient identified, Emergency Drugs available, Suction available, Patient being monitored and Timeout performed Patient Re-evaluated:Patient Re-evaluated prior to inductionOxygen Delivery Method: Circle System Utilized Preoxygenation: Pre-oxygenation with 100% oxygen Intubation Type: IV induction Ventilation: Mask ventilation without difficulty Laryngoscope Size: Mac and 4 Grade View: Grade II Tube type: Oral Tube size: 7.0 mm Number of attempts: 2 Airway Equipment and Method: stylet Placement Confirmation: ETT inserted through vocal cords under direct vision,  positive ETCO2,  CO2 detector and breath sounds checked- equal and bilateral Secured at: 22 cm Tube secured with: Tape Dental Injury: Teeth and Oropharynx as per pre-operative assessment

## 2011-03-13 NOTE — Anesthesia Postprocedure Evaluation (Signed)
  Anesthesia Post-op Note  Patient: Bianca Barnett  Procedure(s) Performed:  BIOPSY TEMPORAL ARTERY - left temporal artery biopsy with doppler   Patient Location: PACU  Anesthesia Type: General  Level of Consciousness: awake and alert   Airway and Oxygen Therapy: Patient Spontanous Breathing  Post-op Pain: mild  Post-op Assessment: Post-op Vital signs reviewed, Patient's Cardiovascular Status Stable, Respiratory Function Stable, Patent Airway and No signs of Nausea or vomiting  Post-op Vital Signs: stable  Complications: No apparent anesthesia complications

## 2011-03-14 ENCOUNTER — Encounter (HOSPITAL_COMMUNITY): Payer: Self-pay | Admitting: Surgery

## 2011-03-14 LAB — GLUCOSE, CAPILLARY: Glucose-Capillary: 136 mg/dL — ABNORMAL HIGH (ref 70–99)

## 2011-03-16 LAB — MRSA CULTURE

## 2011-03-18 ENCOUNTER — Telehealth (INDEPENDENT_AMBULATORY_CARE_PROVIDER_SITE_OTHER): Payer: Self-pay | Admitting: Surgery

## 2011-03-18 NOTE — Telephone Encounter (Signed)
I faxed the path report to [phone]/ [facility]

## 2012-01-04 ENCOUNTER — Emergency Department (HOSPITAL_COMMUNITY)
Admission: EM | Admit: 2012-01-04 | Discharge: 2012-01-04 | Disposition: A | Discharge status: Home / Self Care | Payer: Medicare Other | Attending: Emergency Medicine | Admitting: Emergency Medicine

## 2012-01-04 ENCOUNTER — Emergency Department (HOSPITAL_COMMUNITY): Payer: Medicare Other

## 2012-01-04 ENCOUNTER — Encounter (HOSPITAL_COMMUNITY): Payer: Self-pay | Admitting: Nurse Practitioner

## 2012-01-04 DIAGNOSIS — M129 Arthropathy, unspecified: Secondary | ICD-10-CM | POA: Insufficient documentation

## 2012-01-04 DIAGNOSIS — M1990 Unspecified osteoarthritis, unspecified site: Secondary | ICD-10-CM

## 2012-01-04 DIAGNOSIS — M549 Dorsalgia, unspecified: Secondary | ICD-10-CM | POA: Insufficient documentation

## 2012-01-04 DIAGNOSIS — S0990XA Unspecified injury of head, initial encounter: Secondary | ICD-10-CM | POA: Insufficient documentation

## 2012-01-04 DIAGNOSIS — I10 Essential (primary) hypertension: Secondary | ICD-10-CM | POA: Insufficient documentation

## 2012-01-04 DIAGNOSIS — S0083XA Contusion of other part of head, initial encounter: Secondary | ICD-10-CM | POA: Insufficient documentation

## 2012-01-04 DIAGNOSIS — M25559 Pain in unspecified hip: Secondary | ICD-10-CM | POA: Insufficient documentation

## 2012-01-04 DIAGNOSIS — W1809XA Striking against other object with subsequent fall, initial encounter: Secondary | ICD-10-CM | POA: Insufficient documentation

## 2012-01-04 DIAGNOSIS — S0003XA Contusion of scalp, initial encounter: Secondary | ICD-10-CM | POA: Insufficient documentation

## 2012-01-04 DIAGNOSIS — Y92009 Unspecified place in unspecified non-institutional (private) residence as the place of occurrence of the external cause: Secondary | ICD-10-CM | POA: Insufficient documentation

## 2012-01-04 DIAGNOSIS — M25539 Pain in unspecified wrist: Secondary | ICD-10-CM | POA: Insufficient documentation

## 2012-01-04 DIAGNOSIS — Z794 Long term (current) use of insulin: Secondary | ICD-10-CM | POA: Insufficient documentation

## 2012-01-04 DIAGNOSIS — E119 Type 2 diabetes mellitus without complications: Secondary | ICD-10-CM | POA: Insufficient documentation

## 2012-01-04 DIAGNOSIS — M5030 Other cervical disc degeneration, unspecified cervical region: Secondary | ICD-10-CM | POA: Insufficient documentation

## 2012-01-04 DIAGNOSIS — M25569 Pain in unspecified knee: Secondary | ICD-10-CM | POA: Insufficient documentation

## 2012-01-04 HISTORY — DX: Headache: R51

## 2012-01-04 MED ORDER — MECLIZINE HCL 50 MG PO TABS: 25.0000 mg | ORAL_TABLET | Freq: Three times a day (TID) | ORAL | Status: DC | PRN

## 2012-01-04 MED ORDER — IBUPROFEN 800 MG PO TABS
800.0000 mg | ORAL_TABLET | Freq: Once | ORAL | Status: AC
  Administered 2012-01-04: 800 mg via ORAL
  Filled 2012-01-04: qty 1

## 2012-01-04 MED ORDER — ONDANSETRON 4 MG PO TBDP
4.0000 mg | ORAL_TABLET | Freq: Once | ORAL | Status: AC
  Administered 2012-01-04: 4 mg via ORAL
  Filled 2012-01-04: qty 1

## 2012-01-04 MED ORDER — TRAMADOL HCL 50 MG PO TABS
50.0000 mg | ORAL_TABLET | Freq: Once | ORAL | Status: AC
  Administered 2012-01-04: 50 mg via ORAL
  Filled 2012-01-04: qty 1

## 2012-01-04 NOTE — ED Provider Notes (Signed)
History     CSN: [id number]  Arrival date & time 01/04/12  1709   First MD Initiated Contact with Patient 01/04/12 1839      Chief Complaint  Patient presents with  . Fall    (Consider location/radiation/quality/duration/timing/severity/associated sxs/prior treatment) The history is provided by the patient and a relative.  Karianne TASHA CHOCK is a 71 y.o. female hx of HTN, DM, obesity here with s/p fall. She sat on a chair today and fell through the chair and landed on her back and knees. She also hit her head on the refrigerator. ? LOC no syncope. Her daughter noticed a scalp hematoma so brought her to the ED. She is not on ASA, plavix, or coumadin.    Past Medical History  Diagnosis Date  . Hypertension   . Osteoporosis   . Asthma 03-11-11    Bronchial asthma  . Cancer 03-11-11    ovarian s/p hysterectomy, no problems now.  . Arthritis 03-11-11    osteoarthritis, some neck disc problems  . Diabetes mellitus 03-11-11    Diabetes x 20 yrs. Lantus started 6 months ago.  Marland Kitchen Anxiety 03-11-11    most times very nervous  . Skin abnormalities 03-11-11    Rt. ankle rash. Forehead open lesion(old)  . New onset of headaches, left frontal 03/07/2011    Past Surgical History  Procedure Date  . Temporal artery biopsy / ligation 03-11-11    2003 [facility]  . Abdominal hysterectomy   . Ankle reconstruction 03-11-11    '80's retained hardware rt. ankle.  . Knee arthroscopy w/ debridement 03-11-11    left knee x2  . Appendectomy 03-11-11     with hysterectomy  . Eye surgery 03-11-11    left eye laser surgery  . Shoulder arthrotomy 03-11-11    left shoulder cyst x3 removed.  . Skin surgery 03-11-11    forehead area exc. mass, incision area never closed.  . Artery biopsy 03/13/2011    Procedure: BIOPSY TEMPORAL ARTERY;  Surgeon: Ardeth Sportsman, MD;  Location: [facility];  Service: General;  Laterality: Left;  left temporal artery biopsy with doppler     Family History  Problem  Relation Age of Onset  . Heart disease Mother   . Heart disease Father     History  Substance Use Topics  . Smoking status: Former Smoker    Quit date: 03/10/1981  . Smokeless tobacco: Not on file  . Alcohol Use: No    OB History    Grav Para Term Preterm Abortions TAB SAB Ect Mult Living                  Review of Systems  Musculoskeletal: Positive for back pain.       Knee and L wrist pain  Neurological: Positive for headaches.  All other systems reviewed and are negative.    Allergies  Adhesive; Latex; and Penicillins  Home Medications   Current Outpatient Rx  Name Route Sig Dispense Refill  . AMLODIPINE BESYLATE 5 MG PO TABS Oral Take 5 mg by mouth every morning.     . FUROSEMIDE 40 MG PO TABS Oral Take 40 mg by mouth daily as needed. For swelling    . INSULIN ASPART 100 UNIT/ML [place] SOLN Subcutaneous Inject 14 Units into the skin 3 (three) times daily before meals.    . INSULIN GLARGINE 100 UNIT/ML [place] SOLN Subcutaneous Inject 14 Units into the skin at bedtime.     Marland Kitchen PREDNISOLONE ACETATE  1 % OP SUSP Both Eyes Place 1 drop into both eyes 4 (four) times daily.      BP 156/67  Pulse 84  Temp 98.1 F (36.7 C) (Oral)  Resp 18  SpO2 99%  Physical Exam  Nursing note and vitals reviewed. Constitutional: She is oriented to person, place, and time.       Obese, uncomfortable   HENT:  Head: Normocephalic.  Mouth/Throat: Oropharynx is clear and moist.       Small scalp hematoma, no bleeding or abrasions.   Eyes: Conjunctivae normal are normal. Pupils are equal, round, and reactive to light.  Neck: Normal range of motion. Neck supple.  Cardiovascular: Normal rate, regular rhythm and normal heart sounds.   Pulmonary/Chest: Effort normal and breath sounds normal. No respiratory distress. She has no wheezes.  Abdominal: Soft. Bowel sounds are normal. She exhibits no distension. There is no tenderness. There is no rebound.  Musculoskeletal: Normal range of motion.        Mild L wrist tenderness, nl ROM. ? Tenderness on ROM of bilateral hips. Able to extend and flex hips. Bilateral knees tender laterally. + L lower lumbar tenderness, but no bony tenderness.   Neurological: She is alert and oriented to person, place, and time. No cranial nerve deficit.  Skin: Skin is warm and dry.  Psychiatric: She has a normal mood and affect. Her behavior is normal. Judgment and thought content normal.    ED Course  Procedures (including critical care time)  Labs Reviewed - No data to display Dg Lumbar Spine Complete  01/04/2012  *RADIOLOGY REPORT*  Clinical Data: Fall.  Back pain.  LUMBAR SPINE - COMPLETE 4+ VIEW  Comparison: None.  Findings: Alignment is normal.  There is lower lumbar degenerative disc disease and degenerative facet disease.  No evidence of fracture.  Arterial calcification is noted.  There is sacroiliac osteoarthritis as well.  IMPRESSION: Lower lumbar degenerative disc disease and degenerative facet disease.  No acute finding.  Atherosclerosis of the aorta.   Original Report Authenticated By: Thomasenia Sales, M.D.    Dg Pelvis 1-2 Views  01/04/2012  *RADIOLOGY REPORT*  Clinical Data: Fall.  Pain.  PELVIS - 1-2 VIEW  Comparison: 11/02/2011  Findings: No evidence of pelvic or hip fracture.  There is chronic sacroiliac osteoarthritis.  IMPRESSION: No acute finding   Original Report Authenticated By: Thomasenia Sales, M.D.    Dg Wrist Complete Left  01/04/2012  *RADIOLOGY REPORT*  Clinical Data: Left wrist pain following a fall.  LEFT WRIST - COMPLETE 3+ VIEW  Comparison: Bilateral hand dated 05/30/2010 at [facility].  Findings: Atheromatous arterial calcifications.  No fracture or dislocation.  IMPRESSION: No fracture.   Original Report Authenticated By: Darrol Angel, M.D.    Ct Head Wo Contrast  01/04/2012  *RADIOLOGY REPORT*  Clinical Data:  Fall, hit head.  CT HEAD WITHOUT CONTRAST CT CERVICAL SPINE WITHOUT CONTRAST  Technique:  Multidetector CT  imaging of the head and cervical spine was performed following the standard protocol without intravenous contrast.  Multiplanar CT image reconstructions of the cervical spine were also generated.  Comparison:  01/28/2011  CT HEAD  Findings: Mild chronic small vessel disease changes throughout the deep white matter.  The No acute intracranial abnormality. Specifically, no hemorrhage, hydrocephalus, mass lesion, acute infarction, or significant intracranial injury.  No acute calvarial abnormality. Visualized paranasal sinuses and mastoids clear. Orbital soft tissues unremarkable.  IMPRESSION: No acute intracranial abnormality.  CT CERVICAL SPINE  Findings: Loss of normal cervical lordosis.  Degenerative disc disease diffusely throughout the cervical spine.  No visualized fracture.  No malalignment.  Prevertebral soft tissues are normal.  IMPRESSION: Cervical straightening which may be positional or related to muscle spasm.  No visible acute bony abnormality.   Original Report Authenticated By: Cyndie Chime, M.D.    Ct Cervical Spine Wo Contrast  01/04/2012  *RADIOLOGY REPORT*  Clinical Data:  Fall, hit head.  CT HEAD WITHOUT CONTRAST CT CERVICAL SPINE WITHOUT CONTRAST  Technique:  Multidetector CT imaging of the head and cervical spine was performed following the standard protocol without intravenous contrast.  Multiplanar CT image reconstructions of the cervical spine were also generated.  Comparison:  01/28/2011  CT HEAD  Findings: Mild chronic small vessel disease changes throughout the deep white matter.  The No acute intracranial abnormality. Specifically, no hemorrhage, hydrocephalus, mass lesion, acute infarction, or significant intracranial injury.  No acute calvarial abnormality. Visualized paranasal sinuses and mastoids clear. Orbital soft tissues unremarkable.  IMPRESSION: No acute intracranial abnormality.  CT CERVICAL SPINE  Findings: Loss of normal cervical lordosis.  Degenerative disc disease  diffusely throughout the cervical spine.  No visualized fracture.  No malalignment.  Prevertebral soft tissues are normal.  IMPRESSION: Cervical straightening which may be positional or related to muscle spasm.  No visible acute bony abnormality.   Original Report Authenticated By: Cyndie Chime, M.D.    Dg Knee Complete 4 Views Left  01/04/2012  *RADIOLOGY REPORT*  Clinical Data: Fall.  Pain.  LEFT KNEE - COMPLETE 4+ VIEW  Comparison: 05/26/2010  Findings: All there is a moderate sized joint effusion.  There is advanced tricompartmental osteoarthritis with joint space narrowing and osteophytes.  There are intra-articular loose bodies.  No acute fractures evident.  IMPRESSION: Advanced tricompartmental osteoarthritis.  Joint effusion.  No fractures seen.   Original Report Authenticated By: Thomasenia Sales, M.D.    Dg Knee Complete 4 Views Right  01/04/2012  *RADIOLOGY REPORT*  Clinical Data: Fall.  Moderate pain.  RIGHT KNEE - COMPLETE 4+ VIEW  Comparison: 05/30/2010  Findings: There is a small amount of joint fluid.  There is tricompartmental osteoarthritis with joint space narrowing and osteophytes.  There are intra-articular loose bodies.  No acute or traumatic finding.  IMPRESSION: Advanced osteoarthritis.  Intra-articular loose bodies.  Small joint effusion.  No acute finding.   Original Report Authenticated By: Thomasenia Sales, M.D.      1. Fall at home   2. Arthritis   3. Head injury, closed       MDM  Mane VALREE FEILD is a 71 y.o. female here with s/p fall. Will get CT head/neck, xrays. Will give pain meds and reassess.   8:42 PM CT head/neck showed no bleed or fractures. Xray showed arthritis and mild bilateral knee joint effusions. Pain improved with pain meds. Will d/c home with pain meds.         Richardean Canal, MD 01/04/12 2043

## 2012-01-04 NOTE — ED Notes (Signed)
Pt chair broke she was sitting in this morning and she fell backward hitting head on corner of cabinet and refrigerator. Hit R knee on floor. Hematoma to back of head with localized pain and R knee pain. Cms intact. Daughter arrived to visit pt this afternoon and she decided she should come to er for eval. Pt does report she may have lost conciousness. Now A&Ox4, resp e/u

## 2012-01-04 NOTE — ED Notes (Signed)
Pt back from CT scan reports feeling nauseated and dizzy. EDP notified.

## 2012-09-23 ENCOUNTER — Encounter: Payer: Self-pay | Admitting: Neurology

## 2012-09-23 ENCOUNTER — Ambulatory Visit (INDEPENDENT_AMBULATORY_CARE_PROVIDER_SITE_OTHER): Payer: Medicare Other | Admitting: Neurology

## 2012-09-23 VITALS — BP 144/87 | HR 87 | Ht 63.0 in | Wt 342.0 lb

## 2012-09-23 DIAGNOSIS — R51 Headache: Secondary | ICD-10-CM

## 2012-09-23 DIAGNOSIS — R269 Unspecified abnormalities of gait and mobility: Secondary | ICD-10-CM

## 2012-09-23 DIAGNOSIS — R42 Dizziness and giddiness: Secondary | ICD-10-CM

## 2012-09-23 NOTE — Progress Notes (Signed)
[facility]  PATIENT: Bianca Barnett DOB: 09-24-1940   HISTORICAL  Ms. Bianca Barnett is a 72 year old right-handed AA female who comes in for consult of headaches and balance problems.  She is with her daughter.  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  She describes pain in the frontal and maxillary sinus region bilaterally and also on the right temporal parietal region of her head. She describes the pain as intermittent stabbing, throbbing, and feeling of fullness with associated dizziness and nausea. She states she is very intolerant of many medications and is not able to take many medications due to bad side effects.   She states she was originally diagnosed with sinus infection and given antibiotics although she never had any congestion, only pain. She states that she was sent to an ear nose and throat physician.   CT scan of her head and told her that all of her sinus cavities were normal and showed no inflammation.  She was also worked up with allergy testing which revealed only an allergy to dust mites. She does not remember ever being prescribe any medication specifically for headaches except tramadol, which she states helped with joint pain but caused her swelling and it was recently discontinued.  Temporal artery biopsy was negative.   She states in the last 3 months she has had increased problem with balance, dizziness, feeling that the room is spinning, and is unable to turn her head without evoking a feeling of spinning.  She does not drive and is sedentary most of the day.  She feels that the spinning and balance problems are the most important problem affecting her life right now.  REVIEW OF SYSTEMS: Full 14 system review of systems performed and notable only  for:  Constitutional: Chills, Fatigue  Cardiovascular: Swelling in legs  Ear/Nose/Throat: Ringing in ears, spinning sensation  Skin: Rash, itching, moles Eyes: Blurred vision, loss of vision, eye pain Respiratory: N/A  Gastroitestinal: N/A  Hematology/Lymphatic: N/A  Endocrine: Feeling cold.  Musculoskeletal: Joint pain, joint swelling  Allergy/Immunology: Allergy to dust mites Neurological: headaches Psychiatric: Depression, and decreased energy   ALLERGIES: Allergies  Allergen Reactions  . Adhesive (Tape) Other (See Comments)    BLISTERS   . Latex   . Penicillins Diarrhea and Nausea And Vomiting    All of the cillins    HOME MEDICATIONS: Outpatient Prescriptions Prior to Visit  Medication Sig Dispense Refill  . furosemide (LASIX) 40 MG tablet Take 40 mg by mouth daily as needed. For swelling      . insulin aspart (NOVOLOG) 100 UNIT/ML injection Inject 14 Units into the skin 3 (three) times daily before meals.      . insulin glargine (LANTUS) 100 UNIT/ML injection Inject 14 Units into the skin at bedtime.       . meclizine (ANTIVERT) 50 MG tablet Take 0.5 tablets (25 mg total) by mouth 3 (three) times daily as needed.  30 tablet  0  . prednisoLONE acetate (PRED FORTE) 1 % ophthalmic suspension Place 1 drop into both eyes 4 (four) times daily.      Marland Kitchen amLODipine (NORVASC) 5 MG tablet Take 5 mg by mouth every morning.         Medications  . loratadine (CLARITIN) 10 MG tablet  Sig: Take 10 mg by mouth daily.     PAST MEDICAL HISTORY: Past Medical History  Diagnosis Date  . Hypertension   . Osteoporosis   . Asthma 03-11-11    Bronchial asthma  . Cancer 03-11-11    ovarian s/p hysterectomy, no problems now.  . Arthritis 03-11-11    osteoarthritis, some neck disc problems  . Diabetes mellitus 03-11-11    Diabetes x 20 yrs. Lantus started 6 months ago.  Marland Kitchen Anxiety 03-11-11    most times very nervous  . Skin abnormalities 03-11-11    Rt. ankle rash. Forehead open  lesion(old)  . New onset of headaches, left frontal 03/07/2011    PAST SURGICAL HISTORY: Past Surgical History  Procedure Laterality Date  . Temporal artery biopsy / ligation  03-11-11    2003 [facility]  . Abdominal hysterectomy    . Ankle reconstruction  03-11-11    '80's retained hardware rt. ankle.  . Knee arthroscopy w/ debridement  03-11-11    left knee x2  . Appendectomy  03-11-11     with hysterectomy  . Eye surgery  03-11-11    left eye laser surgery  . Shoulder arthrotomy  03-11-11    left shoulder cyst x3 removed.  . Skin surgery  03-11-11    forehead area exc. mass, incision area never closed.  . Artery biopsy  03/13/2011    Procedure: BIOPSY TEMPORAL ARTERY;  Surgeon: Ardeth Sportsman, MD;  Location: [facility];  Service: General;  Laterality: Left;  left temporal artery biopsy with doppler     FAMILY HISTORY: Family History  Problem Relation Age of Onset  . Heart disease Mother   . Heart disease Father   . High blood pressure Mother     SOCIAL HISTORY: History   Social History  . Marital Status: Divorced    Spouse Name: N/A    Number of Children: 3  . Years of Education: 12   Occupational History  .      Retired   Social History Main Topics  . Smoking status: Former Smoker    Quit date: 03/10/1981  . Smokeless tobacco: Never Used  . Alcohol Use: 0.6 oz/week    1 Shots of liquor per week  . Drug Use: No  . Sexually Active: No   Other Topics Concern  . Not on file   Social History Narrative   Patient is single and she is retired. Patient is has three children.   Right handed.           PHYSICAL EXAM  Filed Vitals:   09/23/12 1011  BP: 144/87  Pulse: 87  Height: 5\' 3"  (1.6 m)  Weight: 342 lb (155.13 kg)   Body mass index is 60.6 kg/(m^2).  Generalized: In no acute distress, Morbidly obese AA female.   Neck: Supple, no carotid bruits   Cardiac: Regular rate rhythm, no murmur   Pulmonary: Clear to auscultation bilaterally    Musculoskeletal: No deformity   Neurological examination   Mentation: Alert oriented to time, place, history taking, language fluent, and causual conversation  Cranial nerve II-XII: Pupils were equal round reactive to light, extraocular movements were ASYMMETRIC, WITH DELAY IN LEFT ADDUCTION, visual field were full on confrontational test. facial sensation and strength were normal. hearing was intact to finger rubbing bilaterally. Uvula tongue midline. Head turning and shoulder shrug and were normal and symmetric.Tongue protrusion into cheek strength was normal. MOTOR: normal bulk and tone, full strength in the BUE, BLE,  fine finger movements normal, no pronator drift SENSORY: normal and symmetric to light touch, temperature, vibration COORDINATION: finger-nose-finger normal REFLEXES: Brachioradialis 1/2, biceps 1/2, triceps 1/2, patellar 1/2, Achilles 1/2 GAIT/STATION:  Morbid obesity, rising up from seated position needs assistance, ambulates with walker, cautious.   DIAGNOSTIC DATA (LABS, IMAGING, TESTING)  CT HEAD WITHOUT CONTRAST  01/04/12 No acute intracranial abnormality.   CT CERVICAL SPINE 01/04/12 Cervical straightening which may be positional or related to muscle spasm. No visible acute bony abnormality.   ASSESSMENT AND PLAN  Ms. Azzure Garabedian is a 72 y.o. year old right-handed AA female has a past medical history of Morbid Obesity, Hypertension, Diabetes mellitus, and Anxiety here with headaches and balance problems.    Ms. Leopard has many comorbid medical problems, morbid obesity, possibility of her constellation of complaints including aging, Morbid obesity, deconditioning.  We will first rule out any structural brain abnormalities such as stroke.  I will go ahead and order home PT for gait and balance training. Return to clinic in 2 months   Levert Feinstein, M.D. Ph.D. 09/23/2012, 11:50 AM  [facility] [address], [address] [place], [place]  [address] [phone]

## 2012-09-23 NOTE — Patient Instructions (Signed)
We will schedule home PT for gait and balance training.  We will schedule MRI brain.  Return in 2 months.

## 2012-10-03 ENCOUNTER — Ambulatory Visit
Admission: RE | Admit: 2012-10-03 | Discharge: 2012-10-03 | Disposition: A | Discharge status: Home / Self Care | Payer: Medicare Other | Source: Ambulatory Visit | Attending: Neurology | Admitting: Neurology

## 2012-10-03 DIAGNOSIS — R42 Dizziness and giddiness: Secondary | ICD-10-CM

## 2012-10-03 DIAGNOSIS — R51 Headache: Secondary | ICD-10-CM

## 2012-10-06 NOTE — Progress Notes (Signed)
Quick Note:  Please call patient, mild age related changes, no change compared to previous study in 2013 ______

## 2012-10-07 NOTE — Progress Notes (Signed)
Quick Note:  Spoke with patient and relayed the results of their MRI. The patient was also reminded of any future appointments. The patient understood and had no questions.  ______ 

## 2012-10-23 ENCOUNTER — Ambulatory Visit: Payer: Medicare Other | Attending: Nurse Practitioner | Admitting: Rehabilitative and Restorative Service Providers"

## 2012-10-23 DIAGNOSIS — R269 Unspecified abnormalities of gait and mobility: Secondary | ICD-10-CM | POA: Insufficient documentation

## 2012-10-23 DIAGNOSIS — IMO0001 Reserved for inherently not codable concepts without codable children: Secondary | ICD-10-CM | POA: Insufficient documentation

## 2012-10-23 DIAGNOSIS — H8110 Benign paroxysmal vertigo, unspecified ear: Secondary | ICD-10-CM | POA: Insufficient documentation

## 2012-10-30 ENCOUNTER — Ambulatory Visit: Payer: Medicare Other | Attending: Nurse Practitioner | Admitting: Rehabilitative and Restorative Service Providers"

## 2012-10-30 DIAGNOSIS — H8110 Benign paroxysmal vertigo, unspecified ear: Secondary | ICD-10-CM | POA: Insufficient documentation

## 2012-10-30 DIAGNOSIS — R269 Unspecified abnormalities of gait and mobility: Secondary | ICD-10-CM | POA: Insufficient documentation

## 2012-10-30 DIAGNOSIS — IMO0001 Reserved for inherently not codable concepts without codable children: Secondary | ICD-10-CM | POA: Insufficient documentation

## 2012-11-03 ENCOUNTER — Ambulatory Visit: Payer: Medicare Other | Admitting: Rehabilitative and Restorative Service Providers"

## 2012-11-18 ENCOUNTER — Ambulatory Visit: Payer: Medicare Other | Admitting: Rehabilitative and Restorative Service Providers"

## 2012-11-19 ENCOUNTER — Telehealth: Payer: Self-pay | Admitting: Neurology

## 2012-11-19 NOTE — Telephone Encounter (Signed)
The therapist working with patient is requesting something prescribed for nausea.  The last two sessions she has worked with the patient, the patient has gotten very sick to her stomach and vomited.  Therapist is requesting something the patient can take before her therapy.

## 2012-11-20 MED ORDER — ONDANSETRON HCL 4 MG PO TABS: 4.0000 mg | ORAL_TABLET | Freq: Three times a day (TID) | ORAL | Status: DC | PRN

## 2012-11-20 NOTE — Telephone Encounter (Signed)
I spoke to patient and she is aware that the prescription for Zofran has been called to her pharmacy.

## 2012-11-20 NOTE — Telephone Encounter (Signed)
I have called in prescription of Zofran as needed for nausea, please let patient know

## 2012-11-23 ENCOUNTER — Ambulatory Visit: Payer: Medicare Other | Admitting: Rehabilitative and Restorative Service Providers"

## 2012-11-25 ENCOUNTER — Ambulatory Visit: Payer: Medicare Other | Admitting: Rehabilitative and Restorative Service Providers"

## 2012-12-01 ENCOUNTER — Ambulatory Visit: Payer: Medicare Other | Admitting: Neurology

## 2012-12-02 ENCOUNTER — Ambulatory Visit: Payer: Medicare Other | Attending: Nurse Practitioner | Admitting: Rehabilitative and Restorative Service Providers"

## 2012-12-02 DIAGNOSIS — R269 Unspecified abnormalities of gait and mobility: Secondary | ICD-10-CM | POA: Insufficient documentation

## 2012-12-02 DIAGNOSIS — H8110 Benign paroxysmal vertigo, unspecified ear: Secondary | ICD-10-CM | POA: Insufficient documentation

## 2012-12-02 DIAGNOSIS — IMO0001 Reserved for inherently not codable concepts without codable children: Secondary | ICD-10-CM | POA: Insufficient documentation

## 2012-12-29 ENCOUNTER — Telehealth: Payer: Self-pay

## 2012-12-29 NOTE — Telephone Encounter (Signed)
Leta Jungling from [facility] called stating the Prior Auth request we submitted for Ondansetron has been approved effective 12/28/2012-12/28/2013.

## 2013-04-12 ENCOUNTER — Telehealth: Payer: Self-pay | Admitting: *Deleted

## 2013-04-12 DIAGNOSIS — H8110 Benign paroxysmal vertigo, unspecified ear: Secondary | ICD-10-CM

## 2013-04-12 NOTE — Telephone Encounter (Signed)
I have reviewed the chart, I saw her previously for vertigo, headaches, had MRI of the brain in July 2014, only mild age appropriate small vessel disease,  I have called her, she complains of recurrent dizziness for 3 weeks, she could not turn to her left side, which induce vertigo.  She had similar dizziness few months ago, responded very well to vestibular rehab.  I will refer her to vestibular rehab. Again, Hinton Dyer please give her a followup appointment with Hoyle Sauer in next available.

## 2013-05-17 ENCOUNTER — Ambulatory Visit: Payer: Medicare HMO | Admitting: Rehabilitative and Restorative Service Providers"

## 2013-06-07 ENCOUNTER — Ambulatory Visit: Payer: Medicare HMO | Attending: Neurology | Admitting: Physical Therapy

## 2013-06-07 DIAGNOSIS — H8110 Benign paroxysmal vertigo, unspecified ear: Secondary | ICD-10-CM | POA: Insufficient documentation

## 2013-06-07 DIAGNOSIS — IMO0001 Reserved for inherently not codable concepts without codable children: Secondary | ICD-10-CM | POA: Insufficient documentation

## 2013-06-07 DIAGNOSIS — R269 Unspecified abnormalities of gait and mobility: Secondary | ICD-10-CM | POA: Insufficient documentation

## 2013-06-10 ENCOUNTER — Ambulatory Visit: Payer: Medicare HMO | Admitting: Physical Therapy

## 2013-06-15 ENCOUNTER — Encounter: Payer: Medicare Other | Admitting: Physical Therapy

## 2013-06-17 ENCOUNTER — Ambulatory Visit: Payer: Medicare HMO | Admitting: Physical Therapy

## 2013-06-22 ENCOUNTER — Encounter: Payer: Medicare Other | Admitting: Rehabilitative and Restorative Service Providers"

## 2013-06-25 ENCOUNTER — Ambulatory Visit: Payer: Medicare HMO | Admitting: Rehabilitative and Restorative Service Providers"

## 2013-06-29 ENCOUNTER — Encounter: Payer: Medicare Other | Admitting: Rehabilitative and Restorative Service Providers"

## 2013-07-01 ENCOUNTER — Encounter: Payer: Medicare Other | Admitting: Physical Therapy

## 2013-07-06 ENCOUNTER — Encounter: Payer: Medicare Other | Admitting: Rehabilitative and Restorative Service Providers"

## 2013-07-08 ENCOUNTER — Encounter: Payer: Medicare Other | Admitting: Physical Therapy

## 2013-07-13 ENCOUNTER — Encounter: Payer: Medicare Other | Admitting: Rehabilitative and Restorative Service Providers"

## 2013-07-31 ENCOUNTER — Emergency Department (INDEPENDENT_AMBULATORY_CARE_PROVIDER_SITE_OTHER)
Admission: EM | Admit: 2013-07-31 | Discharge: 2013-07-31 | Disposition: A | Discharge status: Home / Self Care | Payer: Medicare HMO | Source: Home / Self Care | Attending: Family Medicine | Admitting: Family Medicine

## 2013-07-31 ENCOUNTER — Encounter (HOSPITAL_COMMUNITY): Payer: Self-pay | Admitting: Emergency Medicine

## 2013-07-31 DIAGNOSIS — N390 Urinary tract infection, site not specified: Secondary | ICD-10-CM

## 2013-07-31 LAB — POCT URINALYSIS DIP (DEVICE)
BILIRUBIN URINE: NEGATIVE
Glucose, UA: NEGATIVE mg/dL
Ketones, ur: NEGATIVE mg/dL
Nitrite: POSITIVE — AB
PH: 5.5 (ref 5.0–8.0)
Protein, ur: 100 mg/dL — AB
SPECIFIC GRAVITY, URINE: 1.025 (ref 1.005–1.030)
Urobilinogen, UA: 1 mg/dL (ref 0.0–1.0)

## 2013-07-31 MED ORDER — ESTROGENS, CONJUGATED 0.625 MG/GM VA CREA: 1.0000 g | TOPICAL_CREAM | Freq: Every day | VAGINAL | Status: DC

## 2013-07-31 MED ORDER — CEPHALEXIN 500 MG PO CAPS: 500.0000 mg | ORAL_CAPSULE | Freq: Four times a day (QID) | ORAL | Status: DC

## 2013-07-31 MED ORDER — FLUCONAZOLE 150 MG PO TABS: 150.0000 mg | ORAL_TABLET | Freq: Once | ORAL | Status: DC

## 2013-07-31 NOTE — ED Provider Notes (Signed)
CSN: [id number]     Arrival date & time 07/31/13  1232 History   First MD Initiated Contact with Patient 07/31/13 1332     Chief Complaint  Patient presents with  . Dysuria   (Consider location/radiation/quality/duration/timing/severity/associated sxs/prior Treatment) Patient is a 73 y.o. female presenting with dysuria. The history is provided by the patient.  Dysuria Pain quality:  Burning Pain severity:  Mild Onset quality:  Gradual Duration:  4 days Progression:  Unchanged Chronicity:  New Recent urinary tract infections: no   Worsened by:  Nothing tried Urinary symptoms: frequent urination   Associated symptoms: no fever, no nausea and no vaginal discharge     Past Medical History  Diagnosis Date  . Hypertension   . Osteoporosis   . Asthma 03-11-11    Bronchial asthma  . Cancer 03-11-11    ovarian s/p hysterectomy, no problems now.  . Arthritis 03-11-11    osteoarthritis, some neck disc problems  . Diabetes mellitus 03-11-11    Diabetes x 20 yrs. Lantus started 6 months ago.  Marland Kitchen Anxiety 03-11-11    most times very nervous  . Skin abnormalities 03-11-11    Rt. ankle rash. Forehead open lesion(old)  . New onset of headaches, left frontal 03/07/2011   Past Surgical History  Procedure Laterality Date  . Temporal artery biopsy / ligation  03-11-11    2003 [facility]  . Abdominal hysterectomy    . Ankle reconstruction  03-11-11    '80's retained hardware rt. ankle.  . Knee arthroscopy w/ debridement  03-11-11    left knee x2  . Appendectomy  03-11-11     with hysterectomy  . Eye surgery  03-11-11    left eye laser surgery  . Shoulder arthrotomy  03-11-11    left shoulder cyst x3 removed.  . Skin surgery  03-11-11    forehead area exc. mass, incision area never closed.  . Artery biopsy  03/13/2011    Procedure: BIOPSY TEMPORAL ARTERY;  Surgeon: Adin Hector, MD;  Location: [facility];  Service: General;  Laterality: Left;  left temporal artery biopsy with doppler     Family History  Problem Relation Age of Onset  . Heart disease Mother   . Heart disease Father   . High blood pressure Mother    History  Substance Use Topics  . Smoking status: Former Smoker    Quit date: 03/10/1981  . Smokeless tobacco: Never Used  . Alcohol Use: 0.6 oz/week    1 Shots of liquor per week   OB History   Grav Para Term Preterm Abortions TAB SAB Ect Mult Living                 Review of Systems  Constitutional: Negative.  Negative for fever.  Gastrointestinal: Negative.  Negative for nausea.  Genitourinary: Positive for dysuria, urgency and frequency. Negative for vaginal discharge and pelvic pain.    Allergies  Adhesive; Latex; and Penicillins  Home Medications   Prior to Admission medications   Medication Sig Start Date End Date Taking? Authorizing Provider  furosemide (LASIX) 40 MG tablet Take 40 mg by mouth daily as needed. For swelling    Historical Provider, MD  insulin aspart (NOVOLOG) 100 UNIT/ML injection Inject 14 Units into the skin 3 (three) times daily before meals.    Historical Provider, MD  insulin glargine (LANTUS) 100 UNIT/ML injection Inject 14 Units into the skin at bedtime.     Historical Provider, MD  loratadine (CLARITIN) 10  MG tablet Take 10 mg by mouth daily.    Historical Provider, MD  meclizine (ANTIVERT) 50 MG tablet Take 0.5 tablets (25 mg total) by mouth 3 (three) times daily as needed. 01/04/12   Wandra Arthurs, MD  montelukast (SINGULAIR) 10 MG tablet 10 mg daily. 08/26/12   Historical Provider, MD  ondansetron (ZOFRAN) 4 MG tablet Take 1 tablet (4 mg total) by mouth every 8 (eight) hours as needed for nausea. 11/20/12   Marcial Pacas, MD  prednisoLONE acetate (PRED FORTE) 1 % ophthalmic suspension Place 1 drop into both eyes 4 (four) times daily.    Historical Provider, MD   BP 152/73  Pulse 79  Temp(Src) 97.8 F (36.6 C) (Oral)  Resp 20  SpO2 100% Physical Exam  Nursing note and vitals reviewed. Constitutional: She is  oriented to person, place, and time. She appears well-developed and well-nourished.  Abdominal: Soft. Bowel sounds are normal. There is no tenderness.  Neurological: She is alert and oriented to person, place, and time.  Skin: Skin is warm and dry.    ED Course  Procedures (including critical care time) Labs Review Labs Reviewed  POCT URINALYSIS DIP (DEVICE) - Abnormal; Notable for the following:    Hgb urine dipstick MODERATE (*)    Protein, ur 100 (*)    Nitrite POSITIVE (*)    Leukocytes, UA SMALL (*)    All other components within normal limits    Imaging Review No results found.   MDM   1. UTI (lower urinary tract infection)        Billy Fischer, MD 08/02/13 2144

## 2013-07-31 NOTE — ED Notes (Signed)
Pt  Reports  Sensation  As  Pressure  And  Full ness  With  Discomfort  At the  End  Of  Her  Stream

## 2013-07-31 NOTE — Discharge Instructions (Signed)
Take all of medicine as directed, drink lots of fluids, see your doctor if further problems. °

## 2015-07-04 ENCOUNTER — Ambulatory Visit (INDEPENDENT_AMBULATORY_CARE_PROVIDER_SITE_OTHER): Payer: Medicare Other | Admitting: Neurology

## 2015-07-04 ENCOUNTER — Encounter: Payer: Self-pay | Admitting: Neurology

## 2015-07-04 VITALS — BP 177/73 | HR 80 | Ht 63.0 in | Wt 342.0 lb

## 2015-07-04 DIAGNOSIS — E1142 Type 2 diabetes mellitus with diabetic polyneuropathy: Secondary | ICD-10-CM | POA: Diagnosis not present

## 2015-07-04 DIAGNOSIS — R55 Syncope and collapse: Secondary | ICD-10-CM

## 2015-07-04 DIAGNOSIS — R269 Unspecified abnormalities of gait and mobility: Secondary | ICD-10-CM

## 2015-07-04 DIAGNOSIS — Z794 Long term (current) use of insulin: Secondary | ICD-10-CM

## 2015-07-04 DIAGNOSIS — R42 Dizziness and giddiness: Secondary | ICD-10-CM

## 2015-07-04 DIAGNOSIS — IMO0002 Reserved for concepts with insufficient information to code with codable children: Secondary | ICD-10-CM

## 2015-07-04 NOTE — Progress Notes (Signed)
PATIENT: Bianca Barnett DOB: 05/30/1940  Chief Complaint  Patient presents with  . Possible Seizure Activity    She is here with her daughter, Kieth Brightly, to have her frequent staring spells evaluated.  Reports she has been having these events since age 75 but she has never been diagnosed with seizures.     HISTORICAL  Bianca Barnett is a 75 years old right-handed female, seen in refer by her primary care physician Dr. Miki Kins for evaluation of passing out episode    She had a history of hypertension, obesity, diabetes, insulin-dependent since 2013, she had a history of ovarian cancer, status post oophrectomy, did not require chemoradiation therapy, she also had a history of temporal arteritis, confirmed by biopsy, was treated with steroid in 2012, history of cervical spine disease, gait abnormality, frequent falling, has used the cane since 2010,  wheelchair for long distance, she is a history of diabetic retinopathy, cataract surgery,   I saw her previously in June 2014 for complaints of dizziness, room spinning, she was referred for rehabilitation, with mild improvement, she still has dizziness spell, triggered by emotional stress, excessive chewing, she feel peripheral vision, difficulty focusing, can last for few minutes to all day long.  I have reviewed MRI of the brain July 2014, mild small vessel disease, no acute lesions.  In March 15th 2017, she was sitting in the chair, was bathed by her assistant, when she got up from seated position, she had staring spell,  left arm went weak, she could hear, see, but she could not response,  Lasted for few minutes,  She slowly recovered, no tongue biting, no incontinence,  She also complains of slow worsening gait difficulty, left-sided neck pain, radiating pain to left arm, I personally reviewed CT cervical in 2013, multilevel degenerative disc disease, most severe at C3, C4, C5, with mild canal stenosis     REVIEW OF SYSTEMS: Full 14 system  review of systems performed and notable only for Fatigue, palpitation, swelling in legs, ringing ears, spinning sensation, trouble swallowing, blurred vision, double vision, eye pain, shortness of breath, wheezing, snoring, incontinence, feeling hot, joint pain, joint swelling, achy muscles  ALLERGIES: Allergies  Allergen Reactions  . Adhesive [Tape] Other (See Comments)    BLISTERS   . Aspirin Other (See Comments)    "Imbalance"  . Codeine Other (See Comments)    Per patient, confirmed on an allergy test.  . Latex   . Lisinopril Cough    cough  . Penicillins Diarrhea and Nausea And Vomiting    All of the cillins  . Procaine Rash    rash    HOME MEDICATIONS: Current Outpatient Prescriptions  Medication Sig Dispense Refill  . furosemide (LASIX) 40 MG tablet Take 40 mg by mouth daily as needed. For swelling    . hydrOXYzine (ATARAX/VISTARIL) 25 MG tablet Take 25 mg by mouth. Take 1-2 tablets daily.    . insulin aspart (NOVOLOG) 100 UNIT/ML injection Inject 14 Units into the skin 3 (three) times daily before meals.    . insulin glargine (LANTUS) 100 UNIT/ML injection Inject 14 Units into the skin at bedtime.     Marland Kitchen loratadine (CLARITIN) 10 MG tablet Take 10 mg by mouth daily.    Marland Kitchen losartan (COZAAR) 50 MG tablet Take 0.5 tablets daily.    . meclizine (ANTIVERT) 50 MG tablet Take 0.5 tablets (25 mg total) by mouth 3 (three) times daily as needed. 30 tablet 0  . omeprazole (PRILOSEC) 20 MG capsule  Take 20 mg by mouth daily.    . ondansetron (ZOFRAN) 4 MG tablet Take 1 tablet (4 mg total) by mouth every 8 (eight) hours as needed for nausea. 30 tablet 3  . POTASSIUM PO Take 20 mEq by mouth daily.    . prednisoLONE acetate (PRED FORTE) 1 % ophthalmic suspension Place 1 drop into both eyes 4 (four) times daily.     No current facility-administered medications for this visit.    PAST MEDICAL HISTORY: Past Medical History  Diagnosis Date  . Hypertension   . Osteoporosis   . Asthma  03-11-11    Bronchial asthma  . Cancer ([facility]) 03-11-11    ovarian s/p hysterectomy, no problems now.  . Arthritis 03-11-11    osteoarthritis, some neck disc problems  . Diabetes mellitus 03-11-11    Diabetes x 20 yrs. Lantus started 6 months ago.  Marland Kitchen Anxiety 03-11-11    most times very nervous  . Skin abnormalities 03-11-11    Rt. ankle rash. Forehead open lesion(old)  . New onset of headaches, left frontal 03/07/2011  . Chronic kidney disease   . Obesity   . Hyperlipidemia   . Cervical spine disease     PAST SURGICAL HISTORY: Past Surgical History  Procedure Laterality Date  . Temporal artery biopsy / ligation  03-11-11    2003 [facility]  . Abdominal hysterectomy    . Ankle reconstruction  03-11-11    '80's retained hardware rt. ankle.  . Knee arthroscopy w/ debridement  03-11-11    left knee x2  . Appendectomy  03-11-11     with hysterectomy  . Eye surgery  03-11-11    left eye laser surgery  . Shoulder arthrotomy  03-11-11    left shoulder cyst x3 removed.  . Skin surgery  03-11-11    forehead area exc. mass, incision area never closed.  . Artery biopsy  03/13/2011    Procedure: BIOPSY TEMPORAL ARTERY;  Surgeon: Adin Hector, MD;  Location: [facility];  Service: General;  Laterality: Left;  left temporal artery biopsy with doppler     FAMILY HISTORY: Family History  Problem Relation Age of Onset  . Heart disease Mother   . Heart disease Father   . High blood pressure Mother     SOCIAL HISTORY:  Social History   Social History  . Marital Status: Divorced    Spouse Name: N/A  . Number of Children: 3  . Years of Education: 12   Occupational History  . Retired     Retired   Social History Main Topics  . Smoking status: Former Smoker    Quit date: 03/10/1981  . Smokeless tobacco: Never Used  . Alcohol Use: No  . Drug Use: No  . Sexual Activity: No   Other Topics Concern  . Not on file   Social History Narrative   Patient is single and she is  retired. Patient is has three children.   Right handed.   She lives with her daughters.   1-2 cups coffee per daily.        PHYSICAL EXAM   Filed Vitals:   07/04/15 0901  BP: 177/73  Pulse: 80  Height: 5\' 3"  (1.6 m)  Weight: 342 lb (155.13 kg)    Not recorded      Body mass index is 60.6 kg/(m^2).  PHYSICAL EXAMNIATION:  Gen: NAD, conversant, well nourised, obese, well groomed  Cardiovascular: Regular rate rhythm, no peripheral edema, warm, nontender. Eyes: Conjunctivae clear without exudates or hemorrhage Neck: Supple, no carotid bruise. Pulmonary: Clear to auscultation bilaterally   NEUROLOGICAL EXAM:  MENTAL STATUS: Speech:    Speech is normal; fluent and spontaneous with normal comprehension.  Cognition:     Orientation to time, place and person     Normal recent and remote memory     Normal Attention span and concentration     Normal Language, naming, repeating,spontaneous speech     Fund of knowledge   CRANIAL NERVES: CN II: Visual fields are full to confrontation. Fundoscopic exam is normal with sharp discs and no vascular changes. Pupils are round equal and briskly reactive to light. CN III, IV, VI: extraocular movement are normal. No ptosis. CN V: Facial sensation is intact to pinprick in all 3 divisions bilaterally. Corneal responses are intact.  CN VII: Face is symmetric with normal eye closure and smile. CN VIII: Hearing is normal to rubbing fingers CN IX, X: Palate elevates symmetrically. Phonation is normal. CN XI: Head turning and shoulder shrug are intact CN XII: Tongue is midline with normal movements and no atrophy.  MOTOR: There is no pronator drift of out-stretched arms. Muscle bulk and tone are normal. Muscle strength is normal.  REFLEXES: Reflexes are 1 and symmetric at the biceps, triceps, knees, and ankles. Plantar responses are flexor.  SENSORY: Mildly length dependent decreased to light touch, pinprick, and  vibratory sensation to ankle level   COORDINATION: Rapid alternating movements and fine finger movements are intact. There is no dysmetria on finger-to-nose and heel-knee-shin.    GAIT/STANCE: She needs assistance to get up from seated position, rely on her cane, wide-based, unsteady   DIAGNOSTIC DATA (LABS, IMAGING, TESTING) - I reviewed patient records, labs, notes, testing and imaging myself where available.   ASSESSMENT AND PLAN  Bianca Barnett is a 75 y.o. female   Passout episode  Most consistent with presyncope, with a long-standing history of diabetes, likely related to orthostatic blood pressure changes, remote possibility of seizure    Proceed with EEG,Ultrasound of carotid artery  Cerebral small vessel disease  I also suggested aspirin 81 mg daily  She has multiple vascular risk factors, hypertension, hyperlipidemia, obesity, immobility, diabetes, She should tight control her vascular risk factors,  Increase water intake  Gait abnormality  Multifactorial, this is related to her obesity,   She also complains of left-sided neck pain, radiating pain to left arm, previous cervical spine shows significant cervical degenerative disc disease,   proceed with MRI of the cervical to rule out cervical spondylitic myelopathy  Marcial Pacas, M.D. Ph.D.  [facility] [address], [place], [place] [address] Ph: [phone] Fax: [phone]  CC: Antony Blackbird, MD

## 2015-07-12 ENCOUNTER — Ambulatory Visit (INDEPENDENT_AMBULATORY_CARE_PROVIDER_SITE_OTHER): Payer: Medicare Other

## 2015-07-12 DIAGNOSIS — R269 Unspecified abnormalities of gait and mobility: Secondary | ICD-10-CM

## 2015-07-12 DIAGNOSIS — IMO0002 Reserved for concepts with insufficient information to code with codable children: Secondary | ICD-10-CM

## 2015-07-12 DIAGNOSIS — R55 Syncope and collapse: Secondary | ICD-10-CM

## 2015-07-12 DIAGNOSIS — E1142 Type 2 diabetes mellitus with diabetic polyneuropathy: Secondary | ICD-10-CM

## 2015-07-12 DIAGNOSIS — Z794 Long term (current) use of insulin: Secondary | ICD-10-CM

## 2015-07-13 ENCOUNTER — Inpatient Hospital Stay: Admission: RE | Admit: 2015-07-13 | Payer: Medicare HMO | Source: Ambulatory Visit

## 2015-07-20 ENCOUNTER — Ambulatory Visit
Admission: RE | Admit: 2015-07-20 | Discharge: 2015-07-20 | Disposition: A | Discharge status: Home / Self Care | Payer: Medicare Other | Source: Ambulatory Visit | Attending: Neurology | Admitting: Neurology

## 2015-07-20 DIAGNOSIS — R42 Dizziness and giddiness: Secondary | ICD-10-CM

## 2015-07-20 DIAGNOSIS — Z794 Long term (current) use of insulin: Secondary | ICD-10-CM

## 2015-07-20 DIAGNOSIS — IMO0002 Reserved for concepts with insufficient information to code with codable children: Secondary | ICD-10-CM

## 2015-07-20 DIAGNOSIS — E1142 Type 2 diabetes mellitus with diabetic polyneuropathy: Secondary | ICD-10-CM

## 2015-07-20 DIAGNOSIS — R55 Syncope and collapse: Secondary | ICD-10-CM | POA: Diagnosis not present

## 2015-07-20 DIAGNOSIS — R269 Unspecified abnormalities of gait and mobility: Secondary | ICD-10-CM

## 2015-07-21 ENCOUNTER — Telehealth: Payer: Self-pay | Admitting: Neurology

## 2015-07-21 ENCOUNTER — Ambulatory Visit (INDEPENDENT_AMBULATORY_CARE_PROVIDER_SITE_OTHER): Payer: Medicare Other | Admitting: Neurology

## 2015-07-21 DIAGNOSIS — IMO0002 Reserved for concepts with insufficient information to code with codable children: Secondary | ICD-10-CM

## 2015-07-21 DIAGNOSIS — R55 Syncope and collapse: Secondary | ICD-10-CM

## 2015-07-21 DIAGNOSIS — E1142 Type 2 diabetes mellitus with diabetic polyneuropathy: Secondary | ICD-10-CM

## 2015-07-21 DIAGNOSIS — Z794 Long term (current) use of insulin: Secondary | ICD-10-CM

## 2015-07-21 DIAGNOSIS — R269 Unspecified abnormalities of gait and mobility: Secondary | ICD-10-CM

## 2015-07-21 NOTE — Procedures (Signed)
    History:  Bianca Barnett is a 75 year old patient with a history of diabetes and obesity who has had episodes of dizziness. On 06/14/2015, the patient was noted to have a staring spell while in a seated position, with some left arm weakness. The patient indicated that she could hear and see, but could not respond. The episode lasted a few minutes. The patient being evaluated for this event.  This is a routine EEG. No skull defects are noted. Medications include aspirin, Lasix, hydroxyzine, insulin, Claritin, Cozaar, Antivert, Prilosec, Zofran, potassium supplementation, and prednisolone ophthalmic drops.   EEG classification: Normal awake  Description of the recording: The background rhythms of this recording consists of a fairly well modulated medium amplitude alpha rhythm of 8 Hz that is reactive to eye opening and closure. As the record progresses, the patient appears to remain in the waking state throughout the recording. Photic stimulation was performed, resulting in a bilateral and symmetric photic driving response. Hyperventilation was also performed, resulting in a minimal buildup of the background rhythm activities without significant slowing seen. At no time during the recording does there appear to be evidence of spike or spike wave discharges or evidence of focal slowing. EKG monitor shows no evidence of cardiac rhythm abnormalities with a heart rate of 84. Shortly after the initial EEG was performed, the patient was agitated, confused. EEG was started again and ran for several minutes more, EEG appear to be completely normal during this confusional episode.  Impression: This is a normal EEG recording in the waking state. No evidence of ictal or interictal discharges are seen. During an episode of clinical confusion and agitation, EEG was normal.

## 2015-07-21 NOTE — Telephone Encounter (Signed)
Please call patient, US carotid showed no significant stenosis.

## 2015-07-24 ENCOUNTER — Telehealth: Payer: Self-pay | Admitting: Neurology

## 2015-07-24 NOTE — Telephone Encounter (Signed)
Please call patient, MRI of cervical spine showed multilevel cervical degenerative changes, with variable degree of foraminal stenosis at different levels, there is no evidence of significant central canal stenosis    IMPRESSION: This MRI of the cervical spine without contrast shows the following: 1. At C3-C4 there is right greater than left facet hypertrophy and mild disc bulging was a moderate right foraminal narrowing. There is no definite nerve root compression though there is some encroachment upon the exiting right C4 nerve root. 2. At C4-C5 but there is mild spinal stenosis due to a right paramedian disc protrusion and thickening of the posterior longitudinal ligament and uncovertebral spurring. The thecal sac is distorted on the right but there does not appear to be spinal cord compression. There does not appear to be any nerve root impingement. 3. At C5-C6, there is mild spinal stenosis due to central disc bulge and thickening of the posterior longitudinal ligament. The thecal sac is mildly distorted but there does not appear to be spinal cord compression. There does not appear to be any nerve root impingement. 4. Mild degenerative changes at C6-C7 and C7-T1 do not lead to nerve root impingement.

## 2015-07-24 NOTE — Telephone Encounter (Signed)
Spoke to patient - she is aware of the results and will keep her pending appt to further discuss.

## 2015-07-24 NOTE — Telephone Encounter (Signed)
Spoke to patient she is aware of results

## 2015-07-26 ENCOUNTER — Encounter (HOSPITAL_COMMUNITY): Payer: Self-pay

## 2015-07-26 ENCOUNTER — Emergency Department (HOSPITAL_COMMUNITY)
Admission: EM | Admit: 2015-07-26 | Discharge: 2015-07-26 | Disposition: A | Discharge status: Home / Self Care | Payer: Medicare Other | Attending: Emergency Medicine | Admitting: Emergency Medicine

## 2015-07-26 ENCOUNTER — Emergency Department (HOSPITAL_COMMUNITY): Payer: Medicare Other

## 2015-07-26 DIAGNOSIS — N189 Chronic kidney disease, unspecified: Secondary | ICD-10-CM | POA: Insufficient documentation

## 2015-07-26 DIAGNOSIS — Z87891 Personal history of nicotine dependence: Secondary | ICD-10-CM | POA: Diagnosis not present

## 2015-07-26 DIAGNOSIS — Z8543 Personal history of malignant neoplasm of ovary: Secondary | ICD-10-CM | POA: Diagnosis not present

## 2015-07-26 DIAGNOSIS — N3000 Acute cystitis without hematuria: Secondary | ICD-10-CM | POA: Diagnosis not present

## 2015-07-26 DIAGNOSIS — Z8739 Personal history of other diseases of the musculoskeletal system and connective tissue: Secondary | ICD-10-CM | POA: Diagnosis not present

## 2015-07-26 DIAGNOSIS — E669 Obesity, unspecified: Secondary | ICD-10-CM | POA: Insufficient documentation

## 2015-07-26 DIAGNOSIS — Z91040 Latex allergy status: Secondary | ICD-10-CM | POA: Insufficient documentation

## 2015-07-26 DIAGNOSIS — E860 Dehydration: Secondary | ICD-10-CM | POA: Diagnosis not present

## 2015-07-26 DIAGNOSIS — J45909 Unspecified asthma, uncomplicated: Secondary | ICD-10-CM | POA: Insufficient documentation

## 2015-07-26 DIAGNOSIS — I129 Hypertensive chronic kidney disease with stage 1 through stage 4 chronic kidney disease, or unspecified chronic kidney disease: Secondary | ICD-10-CM | POA: Insufficient documentation

## 2015-07-26 DIAGNOSIS — R112 Nausea with vomiting, unspecified: Secondary | ICD-10-CM | POA: Diagnosis present

## 2015-07-26 DIAGNOSIS — E119 Type 2 diabetes mellitus without complications: Secondary | ICD-10-CM | POA: Diagnosis not present

## 2015-07-26 DIAGNOSIS — Z794 Long term (current) use of insulin: Secondary | ICD-10-CM | POA: Diagnosis not present

## 2015-07-26 DIAGNOSIS — Z880 Allergy status to penicillin: Secondary | ICD-10-CM | POA: Insufficient documentation

## 2015-07-26 DIAGNOSIS — Z79899 Other long term (current) drug therapy: Secondary | ICD-10-CM | POA: Diagnosis not present

## 2015-07-26 DIAGNOSIS — Z8659 Personal history of other mental and behavioral disorders: Secondary | ICD-10-CM | POA: Insufficient documentation

## 2015-07-26 DIAGNOSIS — Z7952 Long term (current) use of systemic steroids: Secondary | ICD-10-CM | POA: Insufficient documentation

## 2015-07-26 LAB — COMPREHENSIVE METABOLIC PANEL
ALT: 14 U/L (ref 14–54)
ANION GAP: 12 (ref 5–15)
AST: 19 U/L (ref 15–41)
Albumin: 3.6 g/dL (ref 3.5–5.0)
Alkaline Phosphatase: 66 U/L (ref 38–126)
BILIRUBIN TOTAL: 0.9 mg/dL (ref 0.3–1.2)
BUN: 15 mg/dL (ref 6–20)
CO2: 22 mmol/L (ref 22–32)
Calcium: 8.6 mg/dL — ABNORMAL LOW (ref 8.9–10.3)
Chloride: 102 mmol/L (ref 101–111)
Creatinine, Ser: 1.14 mg/dL — ABNORMAL HIGH (ref 0.44–1.00)
GFR calc non Af Amer: 47 mL/min — ABNORMAL LOW (ref >60)
GFR, EST AFRICAN AMERICAN: 54 mL/min — AB (ref >60)
GLUCOSE: 184 mg/dL — AB (ref 65–99)
Potassium: 4.4 mmol/L (ref 3.5–5.1)
Sodium: 136 mmol/L (ref 135–145)
TOTAL PROTEIN: 7.7 g/dL (ref 6.5–8.1)

## 2015-07-26 LAB — CBC
HEMATOCRIT: 35.9 % — AB (ref 36.0–46.0)
HEMOGLOBIN: 12.4 g/dL (ref 12.0–15.0)
MCH: 27.4 pg (ref 26.0–34.0)
MCHC: 34.5 g/dL (ref 30.0–36.0)
MCV: 79.4 fL (ref 78.0–100.0)
Platelets: 186 10*3/uL (ref 150–400)
RBC: 4.52 MIL/uL (ref 3.87–5.11)
RDW: 15.6 % — ABNORMAL HIGH (ref 11.5–15.5)
WBC: 16.8 10*3/uL — ABNORMAL HIGH (ref 4.0–10.5)

## 2015-07-26 LAB — URINALYSIS, ROUTINE W REFLEX MICROSCOPIC
Glucose, UA: NEGATIVE mg/dL
KETONES UR: 40 mg/dL — AB
NITRITE: NEGATIVE
PH: 7 (ref 5.0–8.0)
Protein, ur: >300 mg/dL — AB
SPECIFIC GRAVITY, URINE: 1.022 (ref 1.005–1.030)

## 2015-07-26 LAB — URINE MICROSCOPIC-ADD ON

## 2015-07-26 LAB — TROPONIN I: Troponin I: <0.03 ng/mL (ref <0.031)

## 2015-07-26 MED ORDER — CIPROFLOXACIN HCL 500 MG PO TABS
500.0000 mg | ORAL_TABLET | Freq: Once | ORAL | Status: AC
  Administered 2015-07-26: 500 mg via ORAL
  Filled 2015-07-26: qty 1

## 2015-07-26 MED ORDER — ONDANSETRON HCL 4 MG PO TABS: 4.0000 mg | ORAL_TABLET | Freq: Four times a day (QID) | ORAL | Status: AC

## 2015-07-26 MED ORDER — CIPROFLOXACIN HCL 500 MG PO TABS: 500.0000 mg | ORAL_TABLET | Freq: Two times a day (BID) | ORAL | Status: DC

## 2015-07-26 MED ORDER — SODIUM CHLORIDE 0.9 % IV SOLN
INTRAVENOUS | Status: DC
  Administered 2015-07-26: 17:00:00 via INTRAVENOUS

## 2015-07-26 NOTE — ED Notes (Signed)
[facility] called  

## 2015-07-26 NOTE — ED Notes (Signed)
Per EMS, pt from home.  Pt with n/v all day.  No diarrhea.  No abdominal pain or chest pain.  20g Lt hand by EMS.  Vitals:  168/70, hr 100, resp 18, 100% ra

## 2015-07-26 NOTE — ED Notes (Signed)
Bed: [id number] Expected date:  Expected time:  Means of arrival:  Comments: EMS- 75yo F, n/v/Zofran given

## 2015-07-26 NOTE — ED Provider Notes (Signed)
CSN: [id number]     Arrival date & time 07/26/15  1640 History   First MD Initiated Contact with Patient 07/26/15 1652     Chief Complaint  Patient presents with  . Emesis  PT SAID THAT SHE HAS BEEN VOMITING ALL DAY.  SHE C/O PAIN IN HER GU AREA SINCE Thursday (4/20).  THE PT WAS GIVEN 4 MG OF ZOFRAN IV BY EMS. NAUSEA IS GONE NOW.   (Consider location/radiation/quality/duration/timing/severity/associated sxs/prior Treatment) The history is provided by the patient and the EMS personnel.    Past Medical History  Diagnosis Date  . Hypertension   . Osteoporosis   . Asthma 03-11-11    Bronchial asthma  . Cancer ([place]) 03-11-11    ovarian s/p hysterectomy, no problems now.  . Arthritis 03-11-11    osteoarthritis, some neck disc problems  . Diabetes mellitus 03-11-11    Diabetes x 20 yrs. Lantus started 6 months ago.  Marland Kitchen Anxiety 03-11-11    most times very nervous  . Skin abnormalities 03-11-11    Rt. ankle rash. Forehead open lesion(old)  . New onset of headaches, left frontal 03/07/2011  . Chronic kidney disease   . Obesity   . Hyperlipidemia   . Cervical spine disease    Past Surgical History  Procedure Laterality Date  . Temporal artery biopsy / ligation  03-11-11    2003 [facility]  . Abdominal hysterectomy    . Ankle reconstruction  03-11-11    '80's retained hardware rt. ankle.  . Knee arthroscopy w/ debridement  03-11-11    left knee x2  . Appendectomy  03-11-11     with hysterectomy  . Eye surgery  03-11-11    left eye laser surgery  . Shoulder arthrotomy  03-11-11    left shoulder cyst x3 removed.  . Skin surgery  03-11-11    forehead area exc. mass, incision area never closed.  . Artery biopsy  03/13/2011    Procedure: BIOPSY TEMPORAL ARTERY;  Surgeon: Adin Hector, MD;  Location: [facility];  Service: General;  Laterality: Left;  left temporal artery biopsy with doppler    Family History  Problem Relation Age of Onset  . Heart disease Mother   . Heart  disease Father   . High blood pressure Mother    Social History  Substance Use Topics  . Smoking status: Former Smoker    Quit date: 03/10/1981  . Smokeless tobacco: Never Used  . Alcohol Use: No   OB History    No data available     Review of Systems  Gastrointestinal: Positive for nausea and vomiting.  Genitourinary: Positive for dysuria.  All other systems reviewed and are negative.     Allergies  Adhesive; Aspirin; Codeine; Latex; Lisinopril; Penicillins; and Procaine  Home Medications   Prior to Admission medications   Medication Sig Start Date End Date Taking? Authorizing Provider  albuterol (PROVENTIL HFA;VENTOLIN HFA) 108 (90 Base) MCG/ACT inhaler Inhale 1-2 puffs into the lungs every 6 (six) hours as needed for wheezing or shortness of breath.   Yes Historical Provider, MD  furosemide (LASIX) 40 MG tablet Take 40-80 mg by mouth 2 (two) times daily as needed for fluid. For swelling   Yes Historical Provider, MD  hydrOXYzine (ATARAX/VISTARIL) 25 MG tablet Take 25-50 mg by mouth every 6 (six) hours as needed for anxiety.    Yes Historical Provider, MD  insulin aspart (NOVOLOG) 100 UNIT/ML injection Inject 17 Units into the skin 3 (three) times daily  before meals.    Yes Historical Provider, MD  insulin glargine (LANTUS) 100 UNIT/ML injection Inject 27 Units into the skin 2 (two) times daily.    Yes Historical Provider, MD  losartan (COZAAR) 50 MG tablet Take 25 mg by mouth See admin instructions. Takes 25mg  every day except on Wednesday. Wednesdays she doesn't take one.   Yes Historical Provider, MD  prednisoLONE acetate (PRED FORTE) 1 % ophthalmic suspension Place 1 drop into both eyes 4 (four) times daily.   Yes Historical Provider, MD  ciprofloxacin (CIPRO) 500 MG tablet Take 1 tablet (500 mg total) by mouth every 12 (twelve) hours. 07/26/15   Isla Pence, MD  ondansetron (ZOFRAN) 4 MG tablet Take 1 tablet (4 mg total) by mouth every 6 (six) hours. 07/26/15   Isla Pence, MD   BP 179/74 mmHg  Pulse 99  Temp(Src) 101 F (38.3 C) (Oral)  Resp 18  SpO2 92% Physical Exam  Constitutional: She is oriented to person, place, and time. She appears well-developed and well-nourished.  HENT:  Head: Normocephalic and atraumatic.  Right Ear: External ear normal.  Left Ear: External ear normal.  Mouth/Throat: Oropharynx is clear and moist.  Eyes: Conjunctivae and EOM are normal. Pupils are equal, round, and reactive to light.  Neck: Normal range of motion. Neck supple.  Cardiovascular: Normal rate, regular rhythm and normal heart sounds.   Pulmonary/Chest: Effort normal and breath sounds normal.  Abdominal: Soft. Bowel sounds are normal.  Musculoskeletal: Normal range of motion.  Neurological: She is alert and oriented to person, place, and time.  Skin: Skin is warm and dry.  Psychiatric: She has a normal mood and affect. Her behavior is normal.  Nursing note and vitals reviewed.   ED Course  Procedures (including critical care time) Labs Review Labs Reviewed  CBC - Abnormal; Notable for the following:    WBC 16.8 (*)    HCT 35.9 (*)    RDW 15.6 (*)    All other components within normal limits  COMPREHENSIVE METABOLIC PANEL - Abnormal; Notable for the following:    Glucose, Bld 184 (*)    Creatinine, Ser 1.14 (*)    Calcium 8.6 (*)    GFR calc non Af Amer 47 (*)    GFR calc Af Amer 54 (*)    All other components within normal limits  URINALYSIS, ROUTINE W REFLEX MICROSCOPIC (NOT AT [facility]) - Abnormal; Notable for the following:    Color, Urine AMBER (*)    APPearance CLOUDY (*)    Hgb urine dipstick MODERATE (*)    Bilirubin Urine SMALL (*)    Ketones, ur 40 (*)    Protein, ur >300 (*)    Leukocytes, UA MODERATE (*)    All other components within normal limits  URINE MICROSCOPIC-ADD ON - Abnormal; Notable for the following:    Squamous Epithelial / LPF 0-5 (*)    Bacteria, UA MANY (*)    All other components within normal limits   TROPONIN I    Imaging Review Dg Chest 2 View  07/26/2015  CLINICAL DATA:  Patient with nausea and vomiting. EXAM: CHEST  2 VIEW COMPARISON:  Chest radiograph 01/28/2011 FINDINGS: Stable enlarged cardiac and mediastinal contours. Elevation right hemidiaphragm. No consolidative pulmonary opacities. No pleural effusion or pneumothorax. Bilateral AC joint degenerative changes. IMPRESSION: No active cardiopulmonary disease. Electronically Signed   By: Lovey Newcomer M.D.   On: 07/26/2015 17:27   I have personally reviewed and evaluated these images and lab  results as part of my medical decision-making.   EKG Interpretation None      MDM  PT IS FEELING MUCH BETTER. Final diagnoses:  Acute cystitis without hematuria  Non-intractable vomiting with nausea, vomiting of unspecified type  Dehydration      Isla Pence, MD 07/26/15 1850

## 2015-08-09 ENCOUNTER — Encounter: Payer: Self-pay | Admitting: Neurology

## 2015-08-09 ENCOUNTER — Ambulatory Visit (INDEPENDENT_AMBULATORY_CARE_PROVIDER_SITE_OTHER): Payer: Medicare Other | Admitting: Neurology

## 2015-08-09 VITALS — BP 199/84 | HR 96 | Ht 63.0 in | Wt 342.0 lb

## 2015-08-09 DIAGNOSIS — R269 Unspecified abnormalities of gait and mobility: Secondary | ICD-10-CM

## 2015-08-09 DIAGNOSIS — R55 Syncope and collapse: Secondary | ICD-10-CM

## 2015-08-09 DIAGNOSIS — IMO0002 Reserved for concepts with insufficient information to code with codable children: Secondary | ICD-10-CM

## 2015-08-09 NOTE — Progress Notes (Signed)
Chief Complaint  Patient presents with  . Passout Episode    She is here with her daughter, Bianca Barnett, to discuss her EEG results.  . Gait Abnormality    They would like to review her MRI results.      PATIENT: Bianca Barnett DOB: 12-24-1940  Chief Complaint  Patient presents with  . Passout Episode    She is here with her daughter, Bianca Barnett, to discuss her EEG results.  . Gait Abnormality    They would like to review her MRI results.     HISTORICAL  Bianca Barnett is a 75 years old right-handed female, seen in refer by her primary care physician Dr. Miki Kins for evaluation of passing out episode    She had a history of hypertension, obesity, diabetes, insulin-dependent since 2013, she had a history of ovarian cancer, status post oophrectomy, did not require chemoradiation therapy, she also had a history of temporal arteritis, confirmed by biopsy, was treated with steroid in 2012, history of cervical spine disease, gait abnormality, frequent falling, has used the cane since 2010,  wheelchair for long distance, she has a history of diabetic retinopathy, cataract surgery,   I saw her previously in June 2014 for complaints of dizziness, room spinning, she was referred for rehabilitation, with mild improvement, she still has dizziness spell, triggered by emotional stress, excessive chewing, she feel peripheral vision, difficulty focusing, can last for few minutes to all day long.  I have reviewed MRI of the brain July 2014, mild small vessel disease, no acute lesions.  In March 15th 2017, she was sitting in the chair, was bathed by her assistant, when she got up from seated position, she had staring spell,  left arm went weak, she could hear, see, but she could not response,  Lasted for few minutes,  She slowly recovered, no tongue biting, no incontinence,  She also complains of slow worsening gait difficulty, left-sided neck pain, radiating pain to left arm, I personally reviewed CT cervical in  2013, multilevel degenerative disc disease, most severe at C3, C4, C5, with mild canal stenosis   UPDATE May 10th 2017:  She was taken to ED in April 26th 2017, there is evidence of UTI, she was put on cipro, has a lot of GI side effect, now she is better, she has no passing out episode  Laboratory evaluation in July 27 2015 showed elevated WBC 16 point 8, CMP showed elevated creatinine 1.14, UA showed moderate leukocytosis   I personally reviewed MRI of the cervical spine April 2017: There is evidence of degenerative disc disease at different levels, there is no significant foraminal and canal stenosis, no cord signal changes. Ultrasound of carotid artery April 2017 showed no significant stenosis EEG April 2017 was normal  REVIEW OF SYSTEMS: Full 14 system review of systems performed and notable only for Fatigue, palpitation, swelling in legs, ringing ears, spinning sensation, trouble swallowing, blurred vision, double vision, eye pain, shortness of breath, wheezing, snoring, incontinence, feeling hot, joint pain, joint swelling, achy muscles  ALLERGIES: Allergies  Allergen Reactions  . Adhesive [Tape] Other (See Comments)    BLISTERS   . Aspirin Other (See Comments)    "Imbalance"  . Codeine Other (See Comments)    Per patient, confirmed on an allergy test.  . Latex Other (See Comments)    Blisters   . Lisinopril Cough  . Penicillins Diarrhea and Nausea And Vomiting    All of the cillins. Has patient had a PCN reaction causing immediate  rash, facial/tongue/throat swelling, SOB or lightheadedness with hypotension: No Has patient had a PCN reaction causing severe rash involving mucus membranes or skin necrosis: No Has patient had a PCN reaction that required hospitalization No Has patient had a PCN reaction occurring within the last 10 years: Yes If all of the above answers are "NO", then may proceed with Cephalosporin use.   . Procaine Rash    HOME MEDICATIONS: Current  Outpatient Prescriptions  Medication Sig Dispense Refill  . albuterol (PROVENTIL HFA;VENTOLIN HFA) 108 (90 Base) MCG/ACT inhaler Inhale 1-2 puffs into the lungs every 6 (six) hours as needed for wheezing or shortness of breath.    . ciprofloxacin (CIPRO) 500 MG tablet Take 1 tablet (500 mg total) by mouth every 12 (twelve) hours. 10 tablet 0  . furosemide (LASIX) 40 MG tablet Take 40-80 mg by mouth 2 (two) times daily as needed for fluid. For swelling    . hydrOXYzine (ATARAX/VISTARIL) 25 MG tablet Take 25-50 mg by mouth every 6 (six) hours as needed for anxiety.     . insulin aspart (NOVOLOG) 100 UNIT/ML injection Inject 17 Units into the skin 3 (three) times daily before meals.     . insulin glargine (LANTUS) 100 UNIT/ML injection Inject 27 Units into the skin 2 (two) times daily.     Marland Kitchen losartan (COZAAR) 50 MG tablet Take 25 mg by mouth See admin instructions. Takes 25mg  every day except on Wednesday. Wednesdays she doesn't take one.    . ondansetron (ZOFRAN) 4 MG tablet Take 1 tablet (4 mg total) by mouth every 6 (six) hours. 12 tablet 0  . prednisoLONE acetate (PRED FORTE) 1 % ophthalmic suspension Place 1 drop into both eyes 4 (four) times daily.     No current facility-administered medications for this visit.    PAST MEDICAL HISTORY: Past Medical History  Diagnosis Date  . Hypertension   . Osteoporosis   . Asthma 03-11-11    Bronchial asthma  . Cancer ([place]) 03-11-11    ovarian s/p hysterectomy, no problems now.  . Arthritis 03-11-11    osteoarthritis, some neck disc problems  . Diabetes mellitus 03-11-11    Diabetes x 20 yrs. Lantus started 6 months ago.  Marland Kitchen Anxiety 03-11-11    most times very nervous  . Skin abnormalities 03-11-11    Rt. ankle rash. Forehead open lesion(old)  . New onset of headaches, left frontal 03/07/2011  . Chronic kidney disease   . Obesity   . Hyperlipidemia   . Cervical spine disease     PAST SURGICAL HISTORY: Past Surgical History  Procedure  Laterality Date  . Temporal artery biopsy / ligation  03-11-11    2003 [facility]  . Abdominal hysterectomy    . Ankle reconstruction  03-11-11    '80's retained hardware rt. ankle.  . Knee arthroscopy w/ debridement  03-11-11    left knee x2  . Appendectomy  03-11-11     with hysterectomy  . Eye surgery  03-11-11    left eye laser surgery  . Shoulder arthrotomy  03-11-11    left shoulder cyst x3 removed.  . Skin surgery  03-11-11    forehead area exc. mass, incision area never closed.  . Artery biopsy  03/13/2011    Procedure: BIOPSY TEMPORAL ARTERY;  Surgeon: Adin Hector, MD;  Location: [facility];  Service: General;  Laterality: Left;  left temporal artery biopsy with doppler     FAMILY HISTORY: Family History  Problem Relation Age of  Onset  . Heart disease Mother   . Heart disease Father   . High blood pressure Mother     SOCIAL HISTORY:  Social History   Social History  . Marital Status: Divorced    Spouse Name: N/A  . Number of Children: 3  . Years of Education: 12   Occupational History  . Retired     Retired   Social History Main Topics  . Smoking status: Former Smoker    Quit date: 03/10/1981  . Smokeless tobacco: Never Used  . Alcohol Use: No  . Drug Use: No  . Sexual Activity: No   Other Topics Concern  . Not on file   Social History Narrative   Patient is single and she is retired. Patient is has three children.   Right handed.   She lives with her daughters.   1-2 cups coffee per daily.        PHYSICAL EXAM   Filed Vitals:   08/09/15 0915  BP: 199/84  Pulse: 96  Height: 5\' 3"  (1.6 m)  Weight: 342 lb (155.13 kg)    Not recorded      Body mass index is 60.6 kg/(m^2).  PHYSICAL EXAMNIATION:  Gen: NAD, conversant, well nourised, obese, well groomed                     Cardiovascular: Regular rate rhythm, no peripheral edema, warm, nontender. Eyes: Conjunctivae clear without exudates or hemorrhage Neck: Supple, no carotid  bruise. Pulmonary: Clear to auscultation bilaterally   NEUROLOGICAL EXAM:  MENTAL STATUS: Speech:    Speech is normal; fluent and spontaneous with normal comprehension.  Cognition:     Orientation to time, place and person     Normal recent and remote memory     Normal Attention span and concentration     Normal Language, naming, repeating,spontaneous speech     Fund of knowledge   CRANIAL NERVES: CN II: Visual fields are full to confrontation. Fundoscopic exam is normal with sharp discs and no vascular changes. Pupils are round equal and briskly reactive to light. CN III, IV, VI: extraocular movement are normal. No ptosis. CN V: Facial sensation is intact to pinprick in all 3 divisions bilaterally. Corneal responses are intact.  CN VII: Face is symmetric with normal eye closure and smile. CN VIII: Hearing is normal to rubbing fingers CN IX, X: Palate elevates symmetrically. Phonation is normal. CN XI: Head turning and shoulder shrug are intact CN XII: Tongue is midline with normal movements and no atrophy.  MOTOR: There is no pronator drift of out-stretched arms. Muscle bulk and tone are normal. Muscle strength is normal.  REFLEXES: Reflexes are 1 and symmetric at the biceps, triceps, knees, and ankles. Plantar responses are flexor.  SENSORY: Mildly length dependent decreased to light touch, pinprick, and vibratory sensation to ankle level   COORDINATION: Rapid alternating movements and fine finger movements are intact. There is no dysmetria on finger-to-nose and heel-knee-shin.    GAIT/STANCE: She needs assistance to get up from seated position, rely on her cane, wide-based, unsteady   DIAGNOSTIC DATA (LABS, IMAGING, TESTING) - I reviewed patient records, labs, notes, testing and imaging myself where available.   ASSESSMENT AND PLAN  Bianca Barnett is a 75 y.o. female   Passout episode  Most consistent with presyncope, consistent with her history of mild diabetic  peripheral neuropathy  Obesity   Cerebral small vessel disease  I also suggested aspirin 81 mg daily  She  has multiple vascular risk factors, hypertension, hyperlipidemia, obesity, immobility, diabetes, She should tight control her vascular risk factors,  Increase water intake  Gait abnormality  Multifactorial, this is related to her obesity, multiple joints pain, deconditioning  Refer her to physical therapy for gait training  Bianca Barnett, M.D. Ph.D.  [facility] [address] [place], [place] [address] Ph: [phone] Fax: [phone]  CC: Antony Blackbird, MD

## 2016-08-25 ENCOUNTER — Emergency Department (HOSPITAL_COMMUNITY)
Admission: EM | Admit: 2016-08-25 | Discharge: 2016-08-25 | Disposition: A | Discharge status: Home / Self Care | Payer: Medicare Other | Attending: Emergency Medicine | Admitting: Emergency Medicine

## 2016-08-25 ENCOUNTER — Encounter (HOSPITAL_COMMUNITY): Payer: Self-pay

## 2016-08-25 DIAGNOSIS — Z87891 Personal history of nicotine dependence: Secondary | ICD-10-CM | POA: Diagnosis not present

## 2016-08-25 DIAGNOSIS — R21 Rash and other nonspecific skin eruption: Secondary | ICD-10-CM | POA: Insufficient documentation

## 2016-08-25 DIAGNOSIS — Z8543 Personal history of malignant neoplasm of ovary: Secondary | ICD-10-CM | POA: Diagnosis not present

## 2016-08-25 DIAGNOSIS — N189 Chronic kidney disease, unspecified: Secondary | ICD-10-CM | POA: Diagnosis not present

## 2016-08-25 DIAGNOSIS — R300 Dysuria: Secondary | ICD-10-CM | POA: Diagnosis present

## 2016-08-25 DIAGNOSIS — Z91040 Latex allergy status: Secondary | ICD-10-CM | POA: Insufficient documentation

## 2016-08-25 DIAGNOSIS — I129 Hypertensive chronic kidney disease with stage 1 through stage 4 chronic kidney disease, or unspecified chronic kidney disease: Secondary | ICD-10-CM | POA: Insufficient documentation

## 2016-08-25 DIAGNOSIS — Z794 Long term (current) use of insulin: Secondary | ICD-10-CM | POA: Diagnosis not present

## 2016-08-25 DIAGNOSIS — E1122 Type 2 diabetes mellitus with diabetic chronic kidney disease: Secondary | ICD-10-CM | POA: Insufficient documentation

## 2016-08-25 DIAGNOSIS — J45909 Unspecified asthma, uncomplicated: Secondary | ICD-10-CM | POA: Insufficient documentation

## 2016-08-25 DIAGNOSIS — N3001 Acute cystitis with hematuria: Secondary | ICD-10-CM | POA: Diagnosis not present

## 2016-08-25 LAB — URINALYSIS, ROUTINE W REFLEX MICROSCOPIC
Bilirubin Urine: NEGATIVE
GLUCOSE, UA: NEGATIVE mg/dL
Ketones, ur: NEGATIVE mg/dL
NITRITE: NEGATIVE
PH: 5 (ref 5.0–8.0)
PROTEIN: 100 mg/dL — AB
Specific Gravity, Urine: 1.013 (ref 1.005–1.030)

## 2016-08-25 LAB — CBC
HEMATOCRIT: 36.4 % (ref 36.0–46.0)
HEMOGLOBIN: 11.8 g/dL — AB (ref 12.0–15.0)
MCH: 26.7 pg (ref 26.0–34.0)
MCHC: 32.4 g/dL (ref 30.0–36.0)
MCV: 82.4 fL (ref 78.0–100.0)
Platelets: 228 10*3/uL (ref 150–400)
RBC: 4.42 MIL/uL (ref 3.87–5.11)
RDW: 15.3 % (ref 11.5–15.5)
WBC: 8.1 10*3/uL (ref 4.0–10.5)

## 2016-08-25 LAB — BASIC METABOLIC PANEL
ANION GAP: 7 (ref 5–15)
BUN: 27 mg/dL — AB (ref 6–20)
CO2: 25 mmol/L (ref 22–32)
Calcium: 8.7 mg/dL — ABNORMAL LOW (ref 8.9–10.3)
Chloride: 108 mmol/L (ref 101–111)
Creatinine, Ser: 1.24 mg/dL — ABNORMAL HIGH (ref 0.44–1.00)
GFR calc Af Amer: 49 mL/min — ABNORMAL LOW (ref >60)
GFR calc non Af Amer: 42 mL/min — ABNORMAL LOW (ref >60)
Glucose, Bld: 107 mg/dL — ABNORMAL HIGH (ref 65–99)
Potassium: 4.1 mmol/L (ref 3.5–5.1)
Sodium: 140 mmol/L (ref 135–145)

## 2016-08-25 MED ORDER — ONDANSETRON 4 MG PO TBDP: 4.0000 mg | ORAL_TABLET | Freq: Three times a day (TID) | ORAL | 0 refills | Status: DC | PRN

## 2016-08-25 MED ORDER — CEPHALEXIN 500 MG PO CAPS: 500.0000 mg | ORAL_CAPSULE | Freq: Four times a day (QID) | ORAL | 0 refills | Status: AC

## 2016-08-25 MED ORDER — TRIAMCINOLONE ACETONIDE 0.5 % EX CREA: TOPICAL_CREAM | CUTANEOUS | 0 refills | Status: AC

## 2016-08-25 NOTE — ED Triage Notes (Signed)
PT C/O PAINFUL URINATION WITH AN ODOR TOT HE UIRNE X1 WEEK. DENIES FEVER. PT ALSO HAS A RECURRING FOOT RASH. PT STS SHE WAS SEEN BEFORE, BUT THE RASH HAS NEVER BEEN THIS BAD, TRAVELING OVER BOTH FEET AND BETWEEN THE TOES.

## 2016-08-25 NOTE — ED Provider Notes (Signed)
[facility] DEPT Provider Note   CSN: [id number] Arrival date & time: 08/25/16  1139     History   Chief Complaint Chief Complaint  Patient presents with  . Dysuria  . Rash    HPI Bianca Barnett is a 76 y.o. female.  HPI   Bianca Barnett is a 76 y.o. female, with a history of CKD, HTN, DM, and morbid obesity, presenting to the ED with dysuria for about a week. Has not tried any therapies for this issue. Also endorses chills and foul smelling urine. No antibiotic use in the previous three months. States she has been evaluated by Dr. Chapman Fitch for "something with the kidneys."   Also complains of a rash on her feet present for a number of years. Endorses redness, darkening pigment, and burning with minor itching. Requests a renewal of her triamcinolone cream.   Denies hematuria, abdominal pain, fever, vaginal discharge/bleeding, N/V/D, neuro deficits, or any other complaints.    Was evaluated by Dermatologist Saundra Shelling on 05/17/15 and then again on 09/19/15, diagnosed and treated for atopic dermatitis "all over, but worse on the feet." On latest visit, patient was noted to be improving using Triamcinolone 0.1% and this was increased to 0.5% concentration. She was also prescribed 2.5% hydrocortisone.   Requests assistance with selecting a new PCP as her PCP, Dr. Chapman Fitch, has left the area.    Past Medical History:  Diagnosis Date  . Anxiety 03-11-11   most times very nervous  . Arthritis 03-11-11   osteoarthritis, some neck disc problems  . Asthma 03-11-11   Bronchial asthma  . Cancer ([place]) 03-11-11   ovarian s/p hysterectomy, no problems now.  . Cervical spine disease   . Chronic kidney disease   . Diabetes mellitus 03-11-11   Diabetes x 20 yrs. Lantus started 6 months ago.  Marland Kitchen Hyperlipidemia   . Hypertension   . New onset of headaches, left frontal 03/07/2011  . Obesity   . Osteoporosis   . Skin abnormalities 03-11-11   Rt. ankle rash. Forehead open lesion(old)     Patient Active Problem List   Diagnosis Date Noted  . Passed out 07/04/2015  . Abnormality of gait 07/04/2015  . Vertigo 07/04/2015  . New onset of headaches, left frontal 03/07/2011  . Changes in vision, bluriness 03/07/2011  . DM (diabetes mellitus) ([place]) 11/03/2006  . MORBID OBESITY 11/03/2006  . HYPERTENSION 11/03/2006  . OSTEOARTHRITIS 11/03/2006  . HYSTERECTOMY, HX OF 11/03/2006  . ARTHROSCOPY, KNEE, HX OF 11/03/2006    Past Surgical History:  Procedure Laterality Date  . ABDOMINAL HYSTERECTOMY    . ANKLE RECONSTRUCTION  03-11-11   '80's retained hardware rt. ankle.  . APPENDECTOMY  03-11-11    with hysterectomy  . ARTERY BIOPSY  03/13/2011   Procedure: BIOPSY TEMPORAL ARTERY;  Surgeon: Adin Hector, MD;  Location: [facility]S;  Service: General;  Laterality: Left;  left temporal artery biopsy with doppler   . EYE SURGERY  03-11-11   left eye laser surgery  . KNEE ARTHROSCOPY W/ DEBRIDEMENT  03-11-11   left knee x2  . SHOULDER ARTHROTOMY  03-11-11   left shoulder cyst x3 removed.  Marland Kitchen SKIN SURGERY  03-11-11   forehead area exc. mass, incision area never closed.  . TEMPORAL ARTERY BIOPSY / LIGATION  03-11-11   2003 [facility]    OB History    No data available       Home Medications    Prior to Admission medications   Medication  Sig Start Date End Date Taking? Authorizing Provider  albuterol (PROVENTIL HFA;VENTOLIN HFA) 108 (90 Base) MCG/ACT inhaler Inhale 1-2 puffs into the lungs every 6 (six) hours as needed for wheezing or shortness of breath.    [provider]  cephALEXin (KEFLEX) 500 MG capsule Take 1 capsule (500 mg total) by mouth 4 (four) times daily. 08/25/16 09/01/16  Joy, Shawn C, PA-C  ciprofloxacin (CIPRO) 500 MG tablet Take 1 tablet (500 mg total) by mouth every 12 (twelve) hours. 07/26/15   Isla Pence, MD  furosemide (LASIX) 40 MG tablet Take 40-80 mg by mouth 2 (two) times daily as needed for fluid. For swelling    [provider]  hydrOXYzine (ATARAX/VISTARIL) 25 MG tablet Take 25-50 mg by mouth every 6 (six) hours as needed for anxiety.     [provider]  insulin aspart (NOVOLOG) 100 UNIT/ML injection Inject 17 Units into the skin 3 (three) times daily before meals.     [provider]  insulin glargine (LANTUS) 100 UNIT/ML injection Inject 27 Units into the skin 2 (two) times daily.     [provider]  losartan (COZAAR) 50 MG tablet Take 25 mg by mouth See admin instructions. Takes 25mg  every day except on Wednesday. Wednesdays she doesn't take one.    [provider]  ondansetron (ZOFRAN ODT) 4 MG disintegrating tablet Take 1 tablet (4 mg total) by mouth every 8 (eight) hours as needed for nausea or vomiting. 08/25/16   Joy, Shawn C, PA-C  ondansetron (ZOFRAN) 4 MG tablet Take 1 tablet (4 mg total) by mouth every 6 (six) hours. 07/26/15   Isla Pence, MD  prednisoLONE acetate (PRED FORTE) 1 % ophthalmic suspension Place 1 drop into both eyes 4 (four) times daily.    [provider]  triamcinolone cream (KENALOG) 0.5 % Apply twice a day to the rash on your feet. 08/25/16   Joy, Helane Gunther, PA-C    Family History Family History  Problem Relation Age of Onset  . Heart disease Mother   . High blood pressure Mother   . Heart disease Father     Social History Social History  Substance Use Topics  . Smoking status: Former Smoker    Quit date: 03/10/1981  . Smokeless tobacco: Never Used  . Alcohol use No     Allergies   Adhesive [tape]; Aspirin; Codeine; Latex; Lisinopril; Penicillins; and Procaine   Review of Systems Review of Systems  Constitutional: Positive for chills. Negative for fever.  Respiratory: Negative for shortness of breath.   Cardiovascular: Negative for chest pain.  Gastrointestinal: Negative for abdominal pain, diarrhea, nausea and vomiting.  Genitourinary: Positive for dysuria. Negative for frequency, hematuria, vaginal bleeding  and vaginal discharge.  Skin: Positive for color change and rash.  All other systems reviewed and are negative.    Physical Exam Updated Vital Signs BP (!) 177/68 (BP Location: Left Arm)   Pulse 85   Temp 98.2 F (36.8 C) (Oral)   Resp 16   Ht 5' (1.524 m)   Wt (!) 157.4 kg (347 lb)   SpO2 94%   BMI 67.77 kg/m   Physical Exam  Constitutional: She appears well-developed and well-nourished. No distress.  Morbidly obese female  HENT:  Head: Normocephalic and atraumatic.  Eyes: Conjunctivae are normal.  Neck: Neck supple.  Cardiovascular: Normal rate, regular rhythm, normal heart sounds and intact distal pulses.   Pulmonary/Chest: Effort normal and breath sounds normal. No respiratory distress.  Abdominal: Soft.  There is no tenderness. There is no guarding.  Large pannus extending over the pelvis  Musculoskeletal: She exhibits no edema.  Lymphadenopathy:    She has no cervical adenopathy.  Neurological: She is alert.  Skin: Skin is warm and dry. Rash noted. She is not diaphoretic.  Areas of erythema and hyperpigmentation with intermittent flaking to the bilateral dorsal feet. No pustules or vesicles noted.  Psychiatric: She has a normal mood and affect. Her behavior is normal.  Nursing note and vitals reviewed.              ED Treatments / Results  Labs (all labs ordered are listed, but only abnormal results are displayed) Labs Reviewed  URINALYSIS, ROUTINE W REFLEX MICROSCOPIC - Abnormal; Notable for the following:       Result Value   APPearance CLOUDY (*)    Hgb urine dipstick MODERATE (*)    Protein, ur 100 (*)    Leukocytes, UA LARGE (*)    Bacteria, UA RARE (*)    Squamous Epithelial / LPF 6-30 (*)    Non Squamous Epithelial 0-5 (*)    All other components within normal limits  BASIC METABOLIC PANEL - Abnormal; Notable for the following:    Glucose, Bld 107 (*)    BUN 27 (*)    Creatinine, Ser 1.24 (*)    Calcium 8.7 (*)    GFR calc non Af Amer  42 (*)    GFR calc Af Amer 49 (*)    All other components within normal limits  CBC - Abnormal; Notable for the following:    Hemoglobin 11.8 (*)    All other components within normal limits  URINE CULTURE    BUN  Date Value Ref Range Status  08/25/2016 27 (H) 6 - 20 mg/dL Final  07/26/2015 15 6 - 20 mg/dL Final  03/13/2011 18 6 - 23 mg/dL Final  01/28/2011 15 6 - 23 mg/dL Final   Creatinine, Ser  Date Value Ref Range Status  08/25/2016 1.24 (H) 0.44 - 1.00 mg/dL Final  07/26/2015 1.14 (H) 0.44 - 1.00 mg/dL Final  03/13/2011 0.91 0.50 - 1.10 mg/dL Final  01/28/2011 0.88 0.50 - 1.10 mg/dL Final     EKG  EKG Interpretation None       Radiology No results found.  Procedures Procedures (including critical care time)  Medications Ordered in ED Medications - No data to display   Initial Impression / Assessment and Plan / ED Course  I have reviewed the triage vital signs and the nursing notes.  Pertinent labs & imaging results that were available during my care of the patient were reviewed by me and considered in my medical decision making (see chart for details).     Patient presents with urinary discomfort and some signs of UTI on UA. Small increase in creatinine noted. Patient well appearing and can urinate without difficulty. Patient opts for oral hydration at home. Treatment initiated. Keflex initiated due to patient's for developing pyelonephritis. Patient's only reaction to penicillins is GI symptoms. PCP follow-up.  Dermatology follow-up for patient's rash. No red flag symptoms or appearance.    Findings and plan of care discussed with Deno Etienne, DO. Dr. Tyrone Nine personally evaluated and examined this patient.     Final Clinical Impressions(s) / ED Diagnoses   Final diagnoses:  Acute cystitis with hematuria  Rash    New Prescriptions New Prescriptions   CEPHALEXIN (KEFLEX) 500 MG CAPSULE    Take 1 capsule (500 mg total)  by mouth 4 (four) times daily.    ONDANSETRON (ZOFRAN ODT) 4 MG DISINTEGRATING TABLET    Take 1 tablet (4 mg total) by mouth every 8 (eight) hours as needed for nausea or vomiting.   TRIAMCINOLONE CREAM (KENALOG) 0.5 %    Apply twice a day to the rash on your feet.     Lorayne Bender, PA-C 08/25/16 [place], [place], PA-C 08/25/16 [place], [place], DO 08/25/16 1650

## 2016-08-25 NOTE — Discharge Instructions (Signed)
You have evidence of an urinary tract infection. Please take all of your antibiotics until finished!   You may develop abdominal discomfort or diarrhea from the antibiotic.  You may help offset this with probiotics which you can buy or get in yogurt. Do not eat or take the probiotics until 2 hours after your antibiotic. You may ask your pharmacist for recommendations on probiotics. You will need to have your kidney function rechecked in a couple weeks by your PCP to assure this is stable. Today it was 1.24.   Follow up with your dermatologist for treatment of your rash on your feet. May use the triamcinolone, as recommended by your dermatologist..

## 2016-08-28 LAB — URINE CULTURE: Culture: >=100000 — AB

## 2016-08-29 ENCOUNTER — Telehealth: Payer: Self-pay | Admitting: *Deleted

## 2016-08-29 NOTE — Telephone Encounter (Signed)
Post ED Visit - Positive Culture Follow-up  Culture report reviewed by antimicrobial stewardship pharmacist:  []  Elenor Quinones, Pharm.D. [x]  Heide Guile, Pharm.D., BCPS AQ-ID []  Parks Neptune, Pharm.D., BCPS []  Alycia Rossetti, Pharm.D., BCPS []  [place], [place].D., BCPS, AAHIVP []  Legrand Como, Pharm.D., BCPS, AAHIVP []  Salome Arnt, PharmD, BCPS []  Dimitri Ped, PharmD, BCPS []  Vincenza Hews, PharmD, BCPS  Positive urine culture Treated with Cephalexin, organism sensitive to the same and no further patient follow-up is required at this time.  Harlon Flor Otto [facility] 08/29/2016, 2:33 PM

## 2016-10-29 ENCOUNTER — Ambulatory Visit (HOSPITAL_COMMUNITY)
Admission: RE | Admit: 2016-10-29 | Discharge: 2016-10-29 | Disposition: A | Discharge status: Home / Self Care | Payer: Medicare Other | Source: Ambulatory Visit | Attending: Vascular Surgery | Admitting: Vascular Surgery

## 2016-10-29 ENCOUNTER — Other Ambulatory Visit (HOSPITAL_COMMUNITY): Payer: Self-pay | Admitting: Internal Medicine

## 2016-10-29 DIAGNOSIS — R0989 Other specified symptoms and signs involving the circulatory and respiratory systems: Secondary | ICD-10-CM

## 2016-11-07 ENCOUNTER — Other Ambulatory Visit (HOSPITAL_COMMUNITY): Payer: Self-pay | Admitting: Internal Medicine

## 2016-11-07 ENCOUNTER — Ambulatory Visit (HOSPITAL_COMMUNITY)
Admission: RE | Admit: 2016-11-07 | Discharge: 2016-11-07 | Disposition: A | Discharge status: Home / Self Care | Payer: Medicare Other | Source: Ambulatory Visit | Attending: Vascular Surgery | Admitting: Vascular Surgery

## 2016-11-07 DIAGNOSIS — R0989 Other specified symptoms and signs involving the circulatory and respiratory systems: Secondary | ICD-10-CM | POA: Diagnosis not present

## 2016-11-07 LAB — VAS US LOWER EXTREMITY ARTERIAL DUPLEX
LSFDPSV: 56 cm/s
LSFPPSV: 135 cm/s
Left ant tibial distal sys: 19 cm/s
Left super femoral mid sys PSV: -79 cm/s
RIGHT ANT DIST TIBAL SYS PSV: 26 cm/s
RIGHT POST TIB DIST SYS: 63 cm/s
RSFMPSV: -104 cm/s
RSFPPSV: 117 cm/s
Right super femoral dist sys PSV: 83 cm/s
left post tibial dist sys: -54 cm/s

## 2018-02-03 ENCOUNTER — Emergency Department (HOSPITAL_COMMUNITY): Payer: Medicare Other

## 2018-02-03 ENCOUNTER — Encounter (HOSPITAL_COMMUNITY): Payer: Self-pay | Admitting: Emergency Medicine

## 2018-02-03 ENCOUNTER — Other Ambulatory Visit: Payer: Self-pay

## 2018-02-03 ENCOUNTER — Observation Stay (HOSPITAL_COMMUNITY)
Admission: EM | Admit: 2018-02-03 | Discharge: 2018-02-10 | Disposition: A | Discharge status: Home / Self Care | Payer: Medicare Other | Attending: Internal Medicine | Admitting: Internal Medicine

## 2018-02-03 DIAGNOSIS — G5600 Carpal tunnel syndrome, unspecified upper limb: Secondary | ICD-10-CM | POA: Diagnosis not present

## 2018-02-03 DIAGNOSIS — M1711 Unilateral primary osteoarthritis, right knee: Secondary | ICD-10-CM | POA: Diagnosis not present

## 2018-02-03 DIAGNOSIS — E669 Obesity, unspecified: Secondary | ICD-10-CM | POA: Insufficient documentation

## 2018-02-03 DIAGNOSIS — M7989 Other specified soft tissue disorders: Secondary | ICD-10-CM

## 2018-02-03 DIAGNOSIS — Z87891 Personal history of nicotine dependence: Secondary | ICD-10-CM | POA: Insufficient documentation

## 2018-02-03 DIAGNOSIS — N189 Chronic kidney disease, unspecified: Secondary | ICD-10-CM | POA: Insufficient documentation

## 2018-02-03 DIAGNOSIS — Z950 Presence of cardiac pacemaker: Secondary | ICD-10-CM | POA: Insufficient documentation

## 2018-02-03 DIAGNOSIS — R252 Cramp and spasm: Secondary | ICD-10-CM

## 2018-02-03 DIAGNOSIS — Z794 Long term (current) use of insulin: Secondary | ICD-10-CM | POA: Diagnosis not present

## 2018-02-03 DIAGNOSIS — I5033 Acute on chronic diastolic (congestive) heart failure: Secondary | ICD-10-CM | POA: Insufficient documentation

## 2018-02-03 DIAGNOSIS — I10 Essential (primary) hypertension: Secondary | ICD-10-CM

## 2018-02-03 DIAGNOSIS — Z91040 Latex allergy status: Secondary | ICD-10-CM | POA: Diagnosis not present

## 2018-02-03 DIAGNOSIS — J45909 Unspecified asthma, uncomplicated: Secondary | ICD-10-CM | POA: Diagnosis not present

## 2018-02-03 DIAGNOSIS — J810 Acute pulmonary edema: Secondary | ICD-10-CM

## 2018-02-03 DIAGNOSIS — I130 Hypertensive heart and chronic kidney disease with heart failure and stage 1 through stage 4 chronic kidney disease, or unspecified chronic kidney disease: Secondary | ICD-10-CM | POA: Insufficient documentation

## 2018-02-03 DIAGNOSIS — G459 Transient cerebral ischemic attack, unspecified: Secondary | ICD-10-CM | POA: Diagnosis not present

## 2018-02-03 DIAGNOSIS — E1159 Type 2 diabetes mellitus with other circulatory complications: Secondary | ICD-10-CM

## 2018-02-03 DIAGNOSIS — Z79899 Other long term (current) drug therapy: Secondary | ICD-10-CM | POA: Insufficient documentation

## 2018-02-03 DIAGNOSIS — E119 Type 2 diabetes mellitus without complications: Secondary | ICD-10-CM | POA: Insufficient documentation

## 2018-02-03 DIAGNOSIS — R609 Edema, unspecified: Secondary | ICD-10-CM

## 2018-02-03 DIAGNOSIS — R52 Pain, unspecified: Secondary | ICD-10-CM

## 2018-02-03 DIAGNOSIS — R531 Weakness: Secondary | ICD-10-CM | POA: Diagnosis present

## 2018-02-03 LAB — URINALYSIS, ROUTINE W REFLEX MICROSCOPIC
Bilirubin Urine: NEGATIVE
Glucose, UA: NEGATIVE mg/dL
Ketones, ur: NEGATIVE mg/dL
Leukocytes, UA: NEGATIVE
Nitrite: NEGATIVE
PH: 5 (ref 5.0–8.0)
Protein, ur: 100 mg/dL — AB
Specific Gravity, Urine: 1.015 (ref 1.005–1.030)

## 2018-02-03 LAB — CBG MONITORING, ED: Glucose-Capillary: 141 mg/dL — ABNORMAL HIGH (ref 70–99)

## 2018-02-03 LAB — BASIC METABOLIC PANEL
Anion gap: 9 (ref 5–15)
BUN: 17 mg/dL (ref 8–23)
CO2: 27 mmol/L (ref 22–32)
Calcium: 9.2 mg/dL (ref 8.9–10.3)
Chloride: 103 mmol/L (ref 98–111)
Creatinine, Ser: 1.21 mg/dL — ABNORMAL HIGH (ref 0.44–1.00)
GFR calc Af Amer: 49 mL/min — ABNORMAL LOW (ref >60)
GFR calc non Af Amer: 43 mL/min — ABNORMAL LOW (ref >60)
GLUCOSE: 149 mg/dL — AB (ref 70–99)
POTASSIUM: 3.8 mmol/L (ref 3.5–5.1)
Sodium: 139 mmol/L (ref 135–145)

## 2018-02-03 LAB — HEPATIC FUNCTION PANEL
ALBUMIN: 3.2 g/dL — AB (ref 3.5–5.0)
ALT: 14 U/L (ref 0–44)
AST: 14 U/L — AB (ref 15–41)
Alkaline Phosphatase: 65 U/L (ref 38–126)
Bilirubin, Direct: <0.1 mg/dL (ref 0.0–0.2)
Total Bilirubin: 0.5 mg/dL (ref 0.3–1.2)
Total Protein: 7.3 g/dL (ref 6.5–8.1)

## 2018-02-03 LAB — CBC
HCT: 40.8 % (ref 36.0–46.0)
HEMOGLOBIN: 12.3 g/dL (ref 12.0–15.0)
MCH: 25.3 pg — AB (ref 26.0–34.0)
MCHC: 30.1 g/dL (ref 30.0–36.0)
MCV: 84 fL (ref 80.0–100.0)
Platelets: 233 10*3/uL (ref 150–400)
RBC: 4.86 MIL/uL (ref 3.87–5.11)
RDW: 14.6 % (ref 11.5–15.5)
WBC: 7 10*3/uL (ref 4.0–10.5)
nRBC: 0 % (ref 0.0–0.2)

## 2018-02-03 LAB — TROPONIN I: Troponin I: <0.03 ng/mL (ref <0.03)

## 2018-02-03 MED ORDER — FUROSEMIDE 10 MG/ML IJ SOLN
40.0000 mg | Freq: Two times a day (BID) | INTRAMUSCULAR | Status: DC
  Administered 2018-02-04 (×2): 40 mg via INTRAVENOUS
  Filled 2018-02-03 (×2): qty 4

## 2018-02-03 MED ORDER — SODIUM CHLORIDE 0.9% FLUSH
3.0000 mL | Freq: Two times a day (BID) | INTRAVENOUS | Status: DC
  Administered 2018-02-04 – 2018-02-10 (×13): 3 mL via INTRAVENOUS

## 2018-02-03 MED ORDER — ASPIRIN EC 81 MG PO TBEC
81.0000 mg | DELAYED_RELEASE_TABLET | Freq: Every day | ORAL | Status: DC
  Administered 2018-02-04 – 2018-02-10 (×7): 81 mg via ORAL
  Filled 2018-02-03 (×7): qty 1

## 2018-02-03 MED ORDER — GABAPENTIN 100 MG PO CAPS
200.0000 mg | ORAL_CAPSULE | Freq: Every day | ORAL | Status: DC
  Administered 2018-02-04: 200 mg via ORAL
  Filled 2018-02-03: qty 2

## 2018-02-03 MED ORDER — LORAZEPAM 2 MG/ML IJ SOLN
1.0000 mg | Freq: Once | INTRAMUSCULAR | Status: AC
  Administered 2018-02-03: 1 mg via INTRAVENOUS
  Filled 2018-02-03: qty 1

## 2018-02-03 MED ORDER — ALBUTEROL SULFATE (2.5 MG/3ML) 0.083% IN NEBU: 3.0000 mL | INHALATION_SOLUTION | Freq: Four times a day (QID) | RESPIRATORY_TRACT | Status: DC | PRN

## 2018-02-03 MED ORDER — HYDROXYZINE HCL 25 MG PO TABS
50.0000 mg | ORAL_TABLET | Freq: Every day | ORAL | Status: DC
  Administered 2018-02-04 – 2018-02-09 (×6): 50 mg via ORAL
  Filled 2018-02-03 (×7): qty 2

## 2018-02-03 MED ORDER — ENOXAPARIN SODIUM 40 MG/0.4ML ~~LOC~~ SOLN
40.0000 mg | SUBCUTANEOUS | Status: DC
  Administered 2018-02-04: 40 mg via SUBCUTANEOUS
  Filled 2018-02-03: qty 0.4

## 2018-02-03 MED ORDER — IOPAMIDOL (ISOVUE-370) INJECTION 76%
INTRAVENOUS | Status: AC
  Filled 2018-02-03: qty 100

## 2018-02-03 MED ORDER — ACETAMINOPHEN 650 MG RE SUPP: 650.0000 mg | Freq: Four times a day (QID) | RECTAL | Status: DC | PRN

## 2018-02-03 MED ORDER — FUROSEMIDE 10 MG/ML IJ SOLN
80.0000 mg | Freq: Once | INTRAMUSCULAR | Status: AC
  Administered 2018-02-03: 80 mg via INTRAVENOUS
  Filled 2018-02-03: qty 8

## 2018-02-03 MED ORDER — INSULIN ASPART 100 UNIT/ML ~~LOC~~ SOLN
0.0000 [IU] | Freq: Three times a day (TID) | SUBCUTANEOUS | Status: DC
  Administered 2018-02-04 – 2018-02-05 (×5): 3 [IU] via SUBCUTANEOUS
  Administered 2018-02-05 – 2018-02-07 (×4): 2 [IU] via SUBCUTANEOUS
  Administered 2018-02-07: 3 [IU] via SUBCUTANEOUS
  Administered 2018-02-08 – 2018-02-09 (×2): 2 [IU] via SUBCUTANEOUS
  Administered 2018-02-09: 3 [IU] via SUBCUTANEOUS
  Administered 2018-02-09 – 2018-02-10 (×2): 2 [IU] via SUBCUTANEOUS

## 2018-02-03 MED ORDER — ACETAMINOPHEN 325 MG PO TABS
650.0000 mg | ORAL_TABLET | Freq: Four times a day (QID) | ORAL | Status: DC | PRN
  Administered 2018-02-09: 650 mg via ORAL
  Filled 2018-02-03: qty 2

## 2018-02-03 MED ORDER — IRBESARTAN 150 MG PO TABS
150.0000 mg | ORAL_TABLET | Freq: Every day | ORAL | Status: DC
  Administered 2018-02-04 – 2018-02-05 (×2): 150 mg via ORAL
  Filled 2018-02-03 (×2): qty 1

## 2018-02-03 MED ORDER — INSULIN GLARGINE 100 UNIT/ML ~~LOC~~ SOLN
20.0000 [IU] | Freq: Every day | SUBCUTANEOUS | Status: DC
  Administered 2018-02-04: 20 [IU] via SUBCUTANEOUS
  Filled 2018-02-03: qty 0.2

## 2018-02-03 MED ORDER — SENNOSIDES-DOCUSATE SODIUM 8.6-50 MG PO TABS: 1.0000 | ORAL_TABLET | Freq: Every evening | ORAL | Status: DC | PRN

## 2018-02-03 NOTE — Progress Notes (Signed)
Pt came to MRI Dept.  Exam was not obtained due to pt body habitus.  Tried several times with varies RF pads but could not get pt into scanner.  RN notified.

## 2018-02-03 NOTE — ED Notes (Signed)
Pt back from MRI 

## 2018-02-03 NOTE — ED Provider Notes (Addendum)
Patient care assumed at 1600.  Pt here for evaluation following 1 hour of facial droop, now resolved.  Questionable abnormality on CT head. Initial plan to obtain MRI of her brain but patient does not fit in the scanner. Plan to obtain CTA head and neck to evaluate for vascular abnormalities. Patient has no current deficits. She is significantly edematous on examination she does report feeling at mildly short of breath. Chest x-ray pulmonary vascular congestion. Medicine consulted for observation for edema, possible CHF.   Quintella Reichert, MD 02/03/18 2101

## 2018-02-03 NOTE — ED Notes (Signed)
Pt in MRI.

## 2018-02-03 NOTE — H&P (Signed)
History and Physical    Bianca Barnett ZDG:[id number] DOB: November 21, 1940 DOA: 02/03/2018  PCP: Antony Blackbird, MD  Patient coming from: Home  I have personally briefly reviewed patient's old medical records in [place]  Chief Complaint: Progressive swelling and left-sided facial droop yesterday  HPI: Bianca Barnett is a 77 y.o. female with medical history significant for type 2 diabetes, hypertension, hyperlipidemia, and morbid obesity presented to the ED for progressive lower extremity swelling over the last 2 weeks.  She denies any associated shortness of breath but wakes up in the middle of night with palpitations.  She has not had any chest pain.  She denies any orthopnea or paroxysmal nocturnal dyspnea.  She reports good urine output without dysuria.  She reports drinking a lot of fluids.  She also reports experiencing an episode of left sided facial droop yesterday with some slurred speech.  Her granddaughter is present and also witnessed this.  Her symptoms lasted about 1-1.5 hours before resolving and returning to her normal baseline.  She experienced some numbness and tingling in both of her hands but no focal weakness of any of her extremities.  She is also complaining of left shoulder pain worse with rotation of her left shoulder and when reaching across her body.  In addition she is complaining of numbness and tingling in the fingers of both of her hands, more so involving the first 3 digits of both hands.  She ambulates using 2 canes and notices the numbness and tingling more when using these.  ED Course:  Initial vitals show BP 155/103, pulse 72, RR 12, temp 97.26F, SPO2 96% on room air.  Labs are notable for creatinine 1.2 (at baseline), WBC 7.0, hemoglobin 12.3, platelets 233, troponin I <0.03.  Chest x-ray showed cardiomegaly and pulmonary vascular congestion.  CT head without contrast was obtained which showed low-density changes in the insular cortical and subcortical brain  on the left side more prominent than prior suggesting the possibility of extension of infarction.  Case was discussed with neurology who recommended further evaluation with MRI brain.  Patient was unable to undergo MRI due to body habitus.  CTA head and neck were obtained which not show any emergent large vessel occlusion or other acute abnormality.  There was 50% stenosis of the proximal left ICA.  Patient was given IV Lasix 80 mg x 1.  Hospitalist service was consulted to admit for further evaluation of volume overload and suspected TIA.  Review of Systems: As per HPI otherwise 10 point review of systems negative.    Past Medical History:  Diagnosis Date  . Anxiety 03-11-11   most times very nervous  . Arthritis 03-11-11   osteoarthritis, some neck disc problems  . Asthma 03-11-11   Bronchial asthma  . Cancer ([place]) 03-11-11   ovarian s/p hysterectomy, no problems now.  . Cervical spine disease   . Chronic kidney disease   . Diabetes mellitus 03-11-11   Diabetes x 20 yrs. Lantus started 6 months ago.  Marland Kitchen Hyperlipidemia   . Hypertension   . New onset of headaches, left frontal 03/07/2011  . Obesity   . Osteoporosis   . Skin abnormalities 03-11-11   Rt. ankle rash. Forehead open lesion(old)    Past Surgical History:  Procedure Laterality Date  . ABDOMINAL HYSTERECTOMY    . ANKLE RECONSTRUCTION  03-11-11   '80's retained hardware rt. ankle.  . APPENDECTOMY  03-11-11    with hysterectomy  . ARTERY BIOPSY  03/13/2011  Procedure: BIOPSY TEMPORAL ARTERY;  Surgeon: Adin Hector, MD;  Location: [facility];  Service: General;  Laterality: Left;  left temporal artery biopsy with doppler   . EYE SURGERY  03-11-11   left eye laser surgery  . KNEE ARTHROSCOPY W/ DEBRIDEMENT  03-11-11   left knee x2  . SHOULDER ARTHROTOMY  03-11-11   left shoulder cyst x3 removed.  Marland Kitchen SKIN SURGERY  03-11-11   forehead area exc. mass, incision area never closed.  . TEMPORAL ARTERY BIOPSY / LIGATION   03-11-11   2003 [facility]     reports that she quit smoking about 36 years ago. She has never used smokeless tobacco. She reports that she does not drink alcohol or use drugs.  Allergies  Allergen Reactions  . Adhesive [Tape] Other (See Comments)    BLISTERS "MUST USE PAPER TAPE"   . Aspirin Other (See Comments)    "Imbalance"  . Codeine Other (See Comments)    Per patient, confirmed on an allergy test.  . Latex Other (See Comments)    Blisters   . Lisinopril Cough  . Penicillins Diarrhea and Nausea And Vomiting    All of the cillins. Has patient had a PCN reaction causing immediate rash, facial/tongue/throat swelling, SOB or lightheadedness with hypotension: No Has patient had a PCN reaction causing severe rash involving mucus membranes or skin necrosis: No Has patient had a PCN reaction that required hospitalization No Has patient had a PCN reaction occurring within the last 10 years: Yes If all of the above answers are "NO", then may proceed with Cephalosporin use.   . Procaine Rash    Family History  Problem Relation Age of Onset  . Heart disease Mother   . High blood pressure Mother   . Heart disease Father      Prior to Admission medications   Medication Sig Start Date End Date Taking? Authorizing Provider  albuterol (PROVENTIL HFA;VENTOLIN HFA) 108 (90 Base) MCG/ACT inhaler Inhale 1-2 puffs into the lungs every 6 (six) hours as needed for wheezing or shortness of breath.   Yes [provider]  Cholecalciferol (VITAMIN D3) 5000 units CAPS Take 5,000 Units by mouth daily.   Yes [provider]  furosemide (LASIX) 40 MG tablet Take 20-40 mg by mouth as needed for fluid. For swelling   Yes [provider]  gabapentin (NEURONTIN) 100 MG capsule Take 200 mg by mouth at bedtime. 12/02/17  Yes [provider]  hydrOXYzine (ATARAX/VISTARIL) 25 MG tablet Take 50 mg by mouth at bedtime.    Yes [provider]  insulin glargine  (LANTUS) 100 UNIT/ML injection Inject 24 Units into the skin as needed. If over 150   Yes [provider]  ondansetron (ZOFRAN) 4 MG tablet Take 1 tablet (4 mg total) by mouth every 6 (six) hours. 07/26/15  Yes Isla Pence, MD  prednisoLONE acetate (PRED FORTE) 1 % ophthalmic suspension Place 1 drop into both eyes as needed (if show signs of infections).    Yes [provider]  telmisartan (MICARDIS) 40 MG tablet Take 40 mg by mouth as needed. SBP> 160   Yes [provider]  triamcinolone cream (KENALOG) 0.5 % Apply twice a day to the rash on your feet. 08/25/16  Yes Joy, Shawn C, PA-C  ciprofloxacin (CIPRO) 500 MG tablet Take 1 tablet (500 mg total) by mouth every 12 (twelve) hours. Patient not taking: Reported on 02/03/2018 07/26/15   Isla Pence, MD  ondansetron [facility] ODT) 4  MG disintegrating tablet Take 1 tablet (4 mg total) by mouth every 8 (eight) hours as needed for nausea or vomiting. Patient not taking: Reported on 02/03/2018 08/25/16   Layla Maw    Physical Exam: Vitals:   02/03/18 2130 02/03/18 2201 02/03/18 2230 02/03/18 2318  BP: (!) 176/106 (!) 136/104 (!) 150/79 (!) 142/104  Pulse: 84 87 78 72  Resp: 14 13 15 15   Temp:    98 F (36.7 C)  TempSrc:    Oral  SpO2: 98% 97% 95% 100%  Weight:      Height:        Constitutional: Morbidly obese woman lying flat in bed, NAD, calm, comfortable Eyes: PERRL, lids and conjunctivae normal ENMT: Mucous membranes are moist. Posterior pharynx clear of any exudate or lesions.Normal dentition.  Neck: normal, supple, no masses. Respiratory: clear to auscultation anteriorly, no wheezing, no crackles. Normal respiratory effort. No accessory muscle use.  Cardiovascular: Regular rate and rhythm, no murmurs / rubs / gallops. Obese extremities with +1 pitting edema up the shins. Abdomen: mild generalized tenderness, no masses palpated. No hepatosplenomegaly. Bowel sounds positive.  Musculoskeletal: no  clubbing / cyanosis. No joint deformity upper and lower extremities. Left AC joint tender to palpation, pain induced with cross arm test.  Skin: no rashes, lesions, ulcers. No induration Neurologic: CN 2-12 grossly intact. No facial droop or dysarthria present. Sensation intact. No focal deficit. Strength 5/5 in all 4. Positive Tinel and Phalen's sign bilaterally. Psychiatric: Normal judgment and insight. Alert and oriented x 3. Normal mood.     Labs on Admission: I have personally reviewed following labs and imaging studies  CBC: Recent Labs  Lab 02/03/18 1032  WBC 7.0  HGB 12.3  HCT 40.8  MCV 84.0  PLT 767   Basic Metabolic Panel: Recent Labs  Lab 02/03/18 1032  NA 139  K 3.8  CL 103  CO2 27  GLUCOSE 149*  BUN 17  CREATININE 1.21*  CALCIUM 9.2   GFR: Estimated Creatinine Clearance: 56.9 mL/min (A) (by C-G formula based on SCr of 1.21 mg/dL (H)). Liver Function Tests: Recent Labs  Lab 02/03/18 1515  AST 14*  ALT 14  ALKPHOS 65  BILITOT 0.5  PROT 7.3  ALBUMIN 3.2*   No results for input(s): LIPASE, AMYLASE in the last 168 hours. No results for input(s): AMMONIA in the last 168 hours. Coagulation Profile: No results for input(s): INR, PROTIME in the last 168 hours. Cardiac Enzymes: Recent Labs  Lab 02/03/18 1515  TROPONINI <0.03   BNP (last 3 results) No results for input(s): PROBNP in the last 8760 hours. HbA1C: Recent Labs    02/03/18 2314  HGBA1C 8.3*   CBG: Recent Labs  Lab 02/03/18 1037  GLUCAP 141*   Lipid Profile: No results for input(s): CHOL, HDL, LDLCALC, TRIG, CHOLHDL, LDLDIRECT in the last 72 hours. Thyroid Function Tests: No results for input(s): TSH, T4TOTAL, FREET4, T3FREE, THYROIDAB in the last 72 hours. Anemia Panel: No results for input(s): VITAMINB12, FOLATE, FERRITIN, TIBC, IRON, RETICCTPCT in the last 72 hours. Urine analysis:    Component Value Date/Time   COLORURINE YELLOW 02/03/2018 [place]  02/03/2018 1155   LABSPEC 1.015 02/03/2018 1155   PHURINE 5.0 02/03/2018 1155   GLUCOSEU NEGATIVE 02/03/2018 1155   HGBUR SMALL (A) 02/03/2018 1155   BILIRUBINUR NEGATIVE 02/03/2018 1155   KETONESUR NEGATIVE 02/03/2018 1155   PROTEINUR 100 (A) 02/03/2018 1155   UROBILINOGEN 1.0 07/31/2013 1359   NITRITE  NEGATIVE 02/03/2018 1155   LEUKOCYTESUR NEGATIVE 02/03/2018 1155    Radiological Exams on Admission: Ct Angio Head W/cm &/or Wo Cm  Result Date: 02/04/2018 CLINICAL DATA:  Facial weakness EXAM: CT ANGIOGRAPHY HEAD AND NECK TECHNIQUE: Multidetector CT imaging of the head and neck was performed using the standard protocol during bolus administration of intravenous contrast. Multiplanar CT image reconstructions and MIPs were obtained to evaluate the vascular anatomy. Carotid stenosis measurements (when applicable) are obtained utilizing NASCET criteria, using the distal internal carotid diameter as the denominator. CONTRAST:  77mL ISOVUE-370 IOPAMIDOL (ISOVUE-370) INJECTION 76% COMPARISON:  Head CT 02/03/2018 FINDINGS: CT HEAD FINDINGS Brain: There is no mass, hemorrhage or extra-axial collection. The size and configuration of the ventricles and extra-axial CSF spaces are normal. There is no acute or chronic infarction. There is hypoattenuation of the periventricular white matter, most commonly indicating chronic ischemic microangiopathy. Skull: The visualized skull base, calvarium and extracranial soft tissues are normal. Sinuses/Orbits: No fluid levels or advanced mucosal thickening of the visualized paranasal sinuses. No mastoid or middle ear effusion. The orbits are normal. CTA NECK FINDINGS SKELETON: There is no bony spinal canal stenosis. No lytic or blastic lesion. OTHER NECK: Normal pharynx, larynx and major salivary glands. No cervical lymphadenopathy. Unremarkable thyroid gland. UPPER CHEST: No pneumothorax or pleural effusion. No nodules or masses. AORTIC ARCH: There is mild calcific  atherosclerosis of the aortic arch. There is no aneurysm, dissection or hemodynamically significant stenosis of the visualized ascending aorta and aortic arch. Conventional 3 vessel aortic branching pattern. The visualized proximal subclavian arteries are widely patent. RIGHT CAROTID SYSTEM: --Common carotid artery: Widely patent origin without common carotid artery dissection or aneurysm. --Internal carotid artery: No dissection, occlusion or aneurysm. There is calcific atherosclerosis extending into the proximal ICA, resulting in less than 50% stenosis. Retropharyngeal course. --External carotid artery: No acute abnormality. LEFT CAROTID SYSTEM: --Common carotid artery: Widely patent origin without common carotid artery dissection or aneurysm. --Internal carotid artery: No dissection, occlusion or aneurysm. There is calcific atherosclerosis extending into the proximal ICA, resulting in 50% stenosis. Retropharyngeal course. --External carotid artery: No acute abnormality. VERTEBRAL ARTERIES: Right dominant configuration. Limited visualization of the origins. No dissection, occlusion or flow-limiting stenosis to the vertebrobasilar confluence. CTA HEAD FINDINGS ANTERIOR CIRCULATION: --Intracranial internal carotid arteries: Atherosclerotic calcification of the internal carotid arteries at the skull base without hemodynamically significant stenosis. --Anterior cerebral arteries: Normal. Both A1 segments are present. Patent anterior communicating artery. --Middle cerebral arteries: Normal. --Posterior communicating arteries: Absent bilaterally. POSTERIOR CIRCULATION: --Basilar artery: Normal. --Posterior cerebral arteries: Normal. --Superior cerebellar arteries: Normal. --Inferior cerebellar arteries: Normal anterior and posterior inferior cerebellar arteries. VENOUS SINUSES: As permitted by contrast timing, patent. ANATOMIC VARIANTS: None DELAYED PHASE: No parenchymal contrast enhancement. Review of the MIP images  confirms the above findings. IMPRESSION: 1. No emergent large vessel occlusion or other acute abnormality. 2. 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 3.  Aortic atherosclerosis (ICD10-I70.0). Electronically Signed   By: Ulyses Jarred M.D.   On: 02/04/2018 01:19   Dg Chest 2 View  Result Date: 02/03/2018 CLINICAL DATA:  Diffuse body swelling and tachycardia. Wheezing for 2 months. EXAM: CHEST - 2 VIEW COMPARISON:  07/26/2015 chest radiograph FINDINGS: Cardiomegaly and pulmonary vascular congestion noted. There is no evidence of focal airspace disease, pulmonary edema, suspicious pulmonary nodule/mass, pleural effusion, or pneumothorax. No acute bony abnormalities are identified. IMPRESSION: Cardiomegaly with pulmonary vascular congestion. Electronically Signed   By: Cleatis Polka.D.  On: 02/03/2018 16:07   Ct Head Wo Contrast  Result Date: 02/03/2018 CLINICAL DATA:  Weakness over the last week. Facial droop beginning yesterday. EXAM: CT HEAD WITHOUT CONTRAST TECHNIQUE: Contiguous axial images were obtained from the base of the skull through the vertex without intravenous contrast. COMPARISON:  Brain MRI some 07/2012 FINDINGS: Brain: There is some area of cortical and subcortical low density in the insular region on the left that appears more prominent than was seen on previous studies. There could be recent infarction in that area. The remainder the brain shows atrophy and chronic small-vessel ischemic change of the white matter. No mass lesion, hemorrhage, hydrocephalus or extra-axial collection. Vascular: There is atherosclerotic calcification of the major vessels at the base of the brain. Skull: Negative Sinuses/Orbits: Clear/normal Other: None IMPRESSION: There is some low-density in the insular cortical and subcortical brain on the left that appears more prominent than on previous studies, raising the possibility of extension of an infarction in  the left insular region. Evidence of hemorrhage or mass effect. Atrophy and chronic small-vessel ischemic changes elsewhere. Electronically Signed   By: Nelson Chimes M.D.   On: 02/03/2018 15:47   Ct Angio Neck W Or Wo Contrast  Result Date: 02/04/2018 CLINICAL DATA:  Facial weakness EXAM: CT ANGIOGRAPHY HEAD AND NECK TECHNIQUE: Multidetector CT imaging of the head and neck was performed using the standard protocol during bolus administration of intravenous contrast. Multiplanar CT image reconstructions and MIPs were obtained to evaluate the vascular anatomy. Carotid stenosis measurements (when applicable) are obtained utilizing NASCET criteria, using the distal internal carotid diameter as the denominator. CONTRAST:  30mL ISOVUE-370 IOPAMIDOL (ISOVUE-370) INJECTION 76% COMPARISON:  Head CT 02/03/2018 FINDINGS: CT HEAD FINDINGS Brain: There is no mass, hemorrhage or extra-axial collection. The size and configuration of the ventricles and extra-axial CSF spaces are normal. There is no acute or chronic infarction. There is hypoattenuation of the periventricular white matter, most commonly indicating chronic ischemic microangiopathy. Skull: The visualized skull base, calvarium and extracranial soft tissues are normal. Sinuses/Orbits: No fluid levels or advanced mucosal thickening of the visualized paranasal sinuses. No mastoid or middle ear effusion. The orbits are normal. CTA NECK FINDINGS SKELETON: There is no bony spinal canal stenosis. No lytic or blastic lesion. OTHER NECK: Normal pharynx, larynx and major salivary glands. No cervical lymphadenopathy. Unremarkable thyroid gland. UPPER CHEST: No pneumothorax or pleural effusion. No nodules or masses. AORTIC ARCH: There is mild calcific atherosclerosis of the aortic arch. There is no aneurysm, dissection or hemodynamically significant stenosis of the visualized ascending aorta and aortic arch. Conventional 3 vessel aortic branching pattern. The visualized  proximal subclavian arteries are widely patent. RIGHT CAROTID SYSTEM: --Common carotid artery: Widely patent origin without common carotid artery dissection or aneurysm. --Internal carotid artery: No dissection, occlusion or aneurysm. There is calcific atherosclerosis extending into the proximal ICA, resulting in less than 50% stenosis. Retropharyngeal course. --External carotid artery: No acute abnormality. LEFT CAROTID SYSTEM: --Common carotid artery: Widely patent origin without common carotid artery dissection or aneurysm. --Internal carotid artery: No dissection, occlusion or aneurysm. There is calcific atherosclerosis extending into the proximal ICA, resulting in 50% stenosis. Retropharyngeal course. --External carotid artery: No acute abnormality. VERTEBRAL ARTERIES: Right dominant configuration. Limited visualization of the origins. No dissection, occlusion or flow-limiting stenosis to the vertebrobasilar confluence. CTA HEAD FINDINGS ANTERIOR CIRCULATION: --Intracranial internal carotid arteries: Atherosclerotic calcification of the internal carotid arteries at the skull base without hemodynamically significant stenosis. --Anterior cerebral arteries: Normal. Both A1 segments are  present. Patent anterior communicating artery. --Middle cerebral arteries: Normal. --Posterior communicating arteries: Absent bilaterally. POSTERIOR CIRCULATION: --Basilar artery: Normal. --Posterior cerebral arteries: Normal. --Superior cerebellar arteries: Normal. --Inferior cerebellar arteries: Normal anterior and posterior inferior cerebellar arteries. VENOUS SINUSES: As permitted by contrast timing, patent. ANATOMIC VARIANTS: None DELAYED PHASE: No parenchymal contrast enhancement. Review of the MIP images confirms the above findings. IMPRESSION: 1. No emergent large vessel occlusion or other acute abnormality. 2. 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 3.  Aortic  atherosclerosis (ICD10-I70.0). Electronically Signed   By: Ulyses Jarred M.D.   On: 02/04/2018 01:19    EKG: Independently reviewed.  EKG showed sinus rhythm with bigeminy and low voltage.  Assessment/Plan Active Problems:   DM (diabetes mellitus) ([place])   Hypertension associated with diabetes ([place])   TIA (transient ischemic attack)   Peripheral edema   Bianca Barnett is a 77 y.o. female with medical history significant for type 2 diabetes, hypertension, hyperlipidemia, and morbid obesity presented to the ED for progressive lower extremity swelling over the last 2 weeks and transient left facial droop with slurred speech.  TIA: Suspect TIA with transient left facial droop and slurred speech.  She was unable to undergo MRI brain due to body habitus.  CTA head and neck without any emergent large vessel occlusion.  Per ED provider's discussion with neurology, remainder of TIA work-up can be completed on an outpatient basis. -Start aspirin 81 mg daily -Start atorvastatin 40 mg daily -Echocardiogram as below -Monitor on telemetry while admitted  Peripheral edema: Patient with progressive peripheral edema over the last 2 weeks.  BMP is within normal limits, although may be affected due to morbid obesity.  She was given IV Lasix 80 mg once in the ED. -Check echocardiogram -Change to IV Lasix 40 mg twice daily tomorrow  Type 2 diabetes: Hemoglobin A1c is 8.3.  -Lantus and SSI  Hypertension: BP elevated on arrival. 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  May benefit from wrist splints at night.  Acromioclavicular arthropathy: Left shoulder pain is likely due to [facility] joint inflammation or arthropathy.  He can try NSAIDs if renal function stable with diuresis.  DVT prophylaxis: Lovenox Code Status: DNR, confirmed by granddaughter and DNR form at bedside Family Communication: Granddaughter at  bedside.  Disposition Plan: Pending evaluation with echocardiogram and need for further diuresis Consults called: None Admission status: Observation   Zada Finders MD [facility] Pager [phone]  If 7PM-7AM, please contact night-coverage [email or web address] Password [email or web address]  02/04/2018, 1:37 AM

## 2018-02-03 NOTE — ED Provider Notes (Signed)
[facility] DEPART[facility] Provider Note   CSN: [id number] Arrival date & time: 02/03/18  1014     History   Chief Complaint Chief Complaint  Patient presents with  . Weakness    HPI Bianca Barnett is a 77 y.o. female.  Patient complains of some weakness and sort of whole body swelling for about 1-1/2 to 2 weeks.  Yesterday patient had right side facial droop.  That lasted for about an hour.  Then resolved not associated with any other weakness or problems.  Patient has a history of chronic neck pain and does get headaches from that.  She did have a headache yesterday but she gets headaches frequently.  No headache currently.  Patient is mostly concerned about the whole body swelling swelling to both leg swelling to her hands.  She does have Lasix that she takes is actually 40 mg in the evenings.  Which is strange time to take it.  No known history of CHF.  Patient does complain of some mild shortness of breath but no severe shortness of breath.     Past Medical History:  Diagnosis Date  . Anxiety 03-11-11   most times very nervous  . Arthritis 03-11-11   osteoarthritis, some neck disc problems  . Asthma 03-11-11   Bronchial asthma  . Cancer ([place]) 03-11-11   ovarian s/p hysterectomy, no problems now.  . Cervical spine disease   . Chronic kidney disease   . Diabetes mellitus 03-11-11   Diabetes x 20 yrs. Lantus started 6 months ago.  Marland Kitchen Hyperlipidemia   . Hypertension   . New onset of headaches, left frontal 03/07/2011  . Obesity   . Osteoporosis   . Skin abnormalities 03-11-11   Rt. ankle rash. Forehead open lesion(old)    Patient Active Problem List   Diagnosis Date Noted  . Passed out 07/04/2015  . Abnormality of gait 07/04/2015  . Vertigo 07/04/2015  . New onset of headaches, left frontal 03/07/2011  . Changes in vision, bluriness 03/07/2011  . DM (diabetes mellitus) ([place]) 11/03/2006  . MORBID OBESITY 11/03/2006  . HYPERTENSION 11/03/2006   . OSTEOARTHRITIS 11/03/2006  . HYSTERECTOMY, HX OF 11/03/2006  . ARTHROSCOPY, KNEE, HX OF 11/03/2006    Past Surgical History:  Procedure Laterality Date  . ABDOMINAL HYSTERECTOMY    . ANKLE RECONSTRUCTION  03-11-11   '80's retained hardware rt. ankle.  . APPENDECTOMY  03-11-11    with hysterectomy  . ARTERY BIOPSY  03/13/2011   Procedure: BIOPSY TEMPORAL ARTERY;  Surgeon: Adin Hector, MD;  Location: [facility];  Service: General;  Laterality: Left;  left temporal artery biopsy with doppler   . EYE SURGERY  03-11-11   left eye laser surgery  . KNEE ARTHROSCOPY W/ DEBRIDEMENT  03-11-11   left knee x2  . SHOULDER ARTHROTOMY  03-11-11   left shoulder cyst x3 removed.  Marland Kitchen SKIN SURGERY  03-11-11   forehead area exc. mass, incision area never closed.  . TEMPORAL ARTERY BIOPSY / LIGATION  03-11-11   2003 [facility]     OB History   None      Home Medications    Prior to Admission medications   Medication Sig Start Date End Date Taking? Authorizing Provider  albuterol (PROVENTIL HFA;VENTOLIN HFA) 108 (90 Base) MCG/ACT inhaler Inhale 1-2 puffs into the lungs every 6 (six) hours as needed for wheezing or shortness of breath.   Yes [provider]  Cholecalciferol (VITAMIN D3) 5000 units CAPS Take  5,000 Units by mouth daily.   Yes [provider]  furosemide (LASIX) 40 MG tablet Take 20-40 mg by mouth as needed for fluid. For swelling   Yes [provider]  gabapentin (NEURONTIN) 100 MG capsule Take 200 mg by mouth at bedtime. 12/02/17  Yes [provider]  hydrOXYzine (ATARAX/VISTARIL) 25 MG tablet Take 50 mg by mouth at bedtime.    Yes [provider]  insulin glargine (LANTUS) 100 UNIT/ML injection Inject 24 Units into the skin as needed. If over 150   Yes [provider]  ondansetron (ZOFRAN) 4 MG tablet Take 1 tablet (4 mg total) by mouth every 6 (six) hours. 07/26/15  Yes Isla Pence, MD  prednisoLONE acetate (PRED FORTE)  1 % ophthalmic suspension Place 1 drop into both eyes as needed (if show signs of infections).    Yes [provider]  telmisartan (MICARDIS) 40 MG tablet Take 40 mg by mouth as needed. SBP> 160   Yes [provider]  triamcinolone cream (KENALOG) 0.5 % Apply twice a day to the rash on your feet. 08/25/16  Yes Joy, Shawn C, PA-C  ciprofloxacin (CIPRO) 500 MG tablet Take 1 tablet (500 mg total) by mouth every 12 (twelve) hours. Patient not taking: Reported on 02/03/2018 07/26/15   Isla Pence, MD  ondansetron (ZOFRAN ODT) 4 MG disintegrating tablet Take 1 tablet (4 mg total) by mouth every 8 (eight) hours as needed for nausea or vomiting. Patient not taking: Reported on 02/03/2018 08/25/16   Lorayne Bender, PA-C    Family History Family History  Problem Relation Age of Onset  . Heart disease Mother   . High blood pressure Mother   . Heart disease Father     Social History Social History   Tobacco Use  . Smoking status: Former Smoker    Last attempt to quit: 03/10/1981    Years since quitting: 36.9  . Smokeless tobacco: Never Used  Substance Use Topics  . Alcohol use: No    Alcohol/week: 1.0 standard drinks    Types: 1 Shots of liquor per week  . Drug use: No     Allergies   Adhesive [tape]; Aspirin; Codeine; Latex; Lisinopril; Penicillins; and Procaine   Review of Systems Review of Systems  Constitutional: Positive for fatigue. Negative for fever.  HENT: Negative for congestion and facial swelling.   Eyes: Negative for visual disturbance.  Respiratory: Positive for shortness of breath.   Cardiovascular: Positive for leg swelling. Negative for chest pain.  Gastrointestinal: Negative for abdominal pain, nausea and vomiting.  Genitourinary: Negative for dysuria.  Musculoskeletal: Negative for back pain.  Skin: Negative for rash.  Neurological: Positive for facial asymmetry, numbness and headaches. Negative for syncope and speech difficulty.  Hematological:  Does not bruise/bleed easily.  Psychiatric/Behavioral: Negative for confusion.     Physical Exam Updated Vital Signs BP 134/72   Pulse 99   Temp 97.7 F (36.5 C) (Oral)   Resp 20   Ht 1.524 m (5')   Wt (!) 156 kg   SpO2 97%   BMI 67.18 kg/m   Physical Exam  Constitutional: She is oriented to person, place, and time. She appears well-developed and well-nourished. No distress.  HENT:  Head: Normocephalic and atraumatic.  Mouth/Throat: Oropharynx is clear and moist.  Eyes: Pupils are equal, round, and reactive to light. Conjunctivae and EOM are normal.  Neck: Neck supple.  Cardiovascular: Normal rate, regular rhythm and normal heart sounds.  Pulmonary/Chest: Effort normal and breath  sounds normal. No respiratory distress. She exhibits no tenderness.  Abdominal: Soft. Bowel sounds are normal. There is no tenderness.  Musculoskeletal: Normal range of motion. She exhibits edema.  Neurological: She is alert and oriented to person, place, and time. No cranial nerve deficit or sensory deficit. She exhibits normal muscle tone. Coordination normal.  Skin: Skin is warm.  Nursing note and vitals reviewed.    ED Treatments / Results  Labs (all labs ordered are listed, but only abnormal results are displayed) Labs Reviewed  BASIC METABOLIC PANEL - Abnormal; Notable for the following components:      Result Value   Glucose, Bld 149 (*)    Creatinine, Ser 1.21 (*)    GFR calc non Af Amer 43 (*)    GFR calc Af Amer 49 (*)    All other components within normal limits  CBC - Abnormal; Notable for the following components:   MCH 25.3 (*)    All other components within normal limits  URINALYSIS, ROUTINE W REFLEX MICROSCOPIC - Abnormal; Notable for the following components:   Hgb urine dipstick SMALL (*)    Protein, ur 100 (*)    Bacteria, UA RARE (*)    All other components within normal limits  HEPATIC FUNCTION PANEL - Abnormal; Notable for the following components:   Albumin 3.2  (*)    AST 14 (*)    All other components within normal limits  CBG MONITORING, ED - Abnormal; Notable for the following components:   Glucose-Capillary 141 (*)    All other components within normal limits  TROPONIN I    EKG EKG Interpretation  Date/Time:  Tuesday February 03 2018 10:29:15 EST Ventricular Rate:  85 PR Interval:    QRS Duration: 100 QT Interval:  379 QTC Calculation: 412 R Axis:   11 Text Interpretation:  Sinus rhythm Supraventricular bigeminy Short PR interval Low voltage, precordial leads Minimal ST depression, inferior leads Baseline wander in lead(s) V2 Interpretation limited secondary to artifact Confirmed by Fredia Sorrow [phone]) on 02/03/2018 12:19:38 PM   Radiology Dg Chest 2 View  Result Date: 02/03/2018 CLINICAL DATA:  Diffuse body swelling and tachycardia. Wheezing for 2 months. EXAM: CHEST - 2 VIEW COMPARISON:  07/26/2015 chest radiograph FINDINGS: Cardiomegaly and pulmonary vascular congestion noted. There is no evidence of focal airspace disease, pulmonary edema, suspicious pulmonary nodule/mass, pleural effusion, or pneumothorax. No acute bony abnormalities are identified. IMPRESSION: Cardiomegaly with pulmonary vascular congestion. Electronically Signed   By: Margarette Canada M.D.   On: 02/03/2018 16:07   Ct Head Wo Contrast  Result Date: 02/03/2018 CLINICAL DATA:  Weakness over the last week. Facial droop beginning yesterday. EXAM: CT HEAD WITHOUT CONTRAST TECHNIQUE: Contiguous axial images were obtained from the base of the skull through the vertex without intravenous contrast. COMPARISON:  Brain MRI some 07/2012 FINDINGS: Brain: There is some area of cortical and subcortical low density in the insular region on the left that appears more prominent than was seen on previous studies. There could be recent infarction in that area. The remainder the brain shows atrophy and chronic small-vessel ischemic change of the white matter. No mass lesion, hemorrhage,  hydrocephalus or extra-axial collection. Vascular: There is atherosclerotic calcification of the major vessels at the base of the brain. Skull: Negative Sinuses/Orbits: Clear/normal Other: None IMPRESSION: There is some low-density in the insular cortical and subcortical brain on the left that appears more prominent than on previous studies, raising the possibility of extension of an infarction in the left  insular region. Evidence of hemorrhage or mass effect. Atrophy and chronic small-vessel ischemic changes elsewhere. Electronically Signed   By: Nelson Chimes M.D.   On: 02/03/2018 15:47    Procedures Procedures (including critical care time)  Medications Ordered in ED Medications  LORazepam (ATIVAN) injection 1 mg (has no administration in time range)  furosemide (LASIX) injection 80 mg (80 mg Intravenous Given 02/03/18 1623)     Initial Impression / Assessment and Plan / ED Course  I have reviewed the triage vital signs and the nursing notes.  Pertinent labs & imaging results that were available during my care of the patient were reviewed by me and considered in my medical decision making (see chart for details).     Patient yesterday clinically with concern for possible TIA.  Head CT was done for this.  And this does show some abnormalities.  Discussed with neuro hospitalist Dr. Katherine Roan.  Recommends MRI.  If MRI neck negative they states she can go home.  If positive though they need to be called again.  In addition for the swelling patient had chest x-ray which shows some pulmonary vascular congestion may be some mild pulmonary edema.  Patient is normally on 40 mg Lasix given 80 mg Lasix here IV.  Patient still had additional labs pending.  Patient may require admission for this as well.  Patient turned over to evening physician who will follow up the rest of the labs and follow-up the MRI results.  Patient given an order for some Ativan to help her get through the MRI.  Patient's  symptoms were predominantly right-sided facial droop which occurred for an hour yesterday no other significant symptoms at all.  Final Clinical Impressions(s) / ED Diagnoses   Final diagnoses:  TIA (transient ischemic attack)  Leg swelling  Acute pulmonary edema [facility])    ED Discharge Orders    None       Fredia Sorrow, MD 02/03/18 1704

## 2018-02-03 NOTE — ED Notes (Signed)
Pt back from CT

## 2018-02-03 NOTE — ED Triage Notes (Signed)
Per EMS pt is from home and complains of weakness x1 week.  Family states that she had a facial droop yesterday.  Pt has not cardiac history but did have an irregular heart rate and complained of some SOB.  AOx4 NAD noted at this time.

## 2018-02-03 NOTE — ED Notes (Signed)
MRI notified that pt is ready for transport. 

## 2018-02-03 NOTE — ED Notes (Signed)
Per EDP pt can eat

## 2018-02-04 ENCOUNTER — Observation Stay (HOSPITAL_BASED_OUTPATIENT_CLINIC_OR_DEPARTMENT_OTHER): Payer: Medicare Other

## 2018-02-04 ENCOUNTER — Observation Stay (HOSPITAL_COMMUNITY): Payer: Medicare Other

## 2018-02-04 DIAGNOSIS — J810 Acute pulmonary edema: Secondary | ICD-10-CM | POA: Diagnosis not present

## 2018-02-04 DIAGNOSIS — I5030 Unspecified diastolic (congestive) heart failure: Secondary | ICD-10-CM

## 2018-02-04 DIAGNOSIS — M7989 Other specified soft tissue disorders: Secondary | ICD-10-CM | POA: Diagnosis not present

## 2018-02-04 DIAGNOSIS — G459 Transient cerebral ischemic attack, unspecified: Secondary | ICD-10-CM | POA: Diagnosis not present

## 2018-02-04 DIAGNOSIS — I10 Essential (primary) hypertension: Secondary | ICD-10-CM

## 2018-02-04 DIAGNOSIS — E1159 Type 2 diabetes mellitus with other circulatory complications: Secondary | ICD-10-CM | POA: Diagnosis not present

## 2018-02-04 DIAGNOSIS — R609 Edema, unspecified: Secondary | ICD-10-CM

## 2018-02-04 LAB — CBC
HEMATOCRIT: 36.4 % (ref 36.0–46.0)
Hemoglobin: 11.6 g/dL — ABNORMAL LOW (ref 12.0–15.0)
MCH: 26.2 pg (ref 26.0–34.0)
MCHC: 31.9 g/dL (ref 30.0–36.0)
MCV: 82.4 fL (ref 80.0–100.0)
Platelets: 193 10*3/uL (ref 150–400)
RBC: 4.42 MIL/uL (ref 3.87–5.11)
RDW: 14.6 % (ref 11.5–15.5)
WBC: 7.6 10*3/uL (ref 4.0–10.5)
nRBC: 0 % (ref 0.0–0.2)

## 2018-02-04 LAB — LIPID PANEL
CHOL/HDL RATIO: 3.4 ratio
Cholesterol: 177 mg/dL (ref 0–200)
HDL: 52 mg/dL (ref >40)
LDL CALC: 107 mg/dL — AB (ref 0–99)
TRIGLYCERIDES: 92 mg/dL (ref <150)
VLDL: 18 mg/dL (ref 0–40)

## 2018-02-04 LAB — ECHOCARDIOGRAM COMPLETE
Height: 60 in
WEIGHTICAEL: 5504 [oz_av]

## 2018-02-04 LAB — BRAIN NATRIURETIC PEPTIDE: B NATRIURETIC PEPTIDE 5: 91.8 pg/mL (ref 0.0–100.0)

## 2018-02-04 LAB — GLUCOSE, CAPILLARY
GLUCOSE-CAPILLARY: 184 mg/dL — AB (ref 70–99)
GLUCOSE-CAPILLARY: 213 mg/dL — AB (ref 70–99)
Glucose-Capillary: 168 mg/dL — ABNORMAL HIGH (ref 70–99)
Glucose-Capillary: 177 mg/dL — ABNORMAL HIGH (ref 70–99)

## 2018-02-04 LAB — BASIC METABOLIC PANEL
Anion gap: 5 (ref 5–15)
BUN: 18 mg/dL (ref 8–23)
CHLORIDE: 102 mmol/L (ref 98–111)
CO2: 31 mmol/L (ref 22–32)
CREATININE: 1.31 mg/dL — AB (ref 0.44–1.00)
Calcium: 8.8 mg/dL — ABNORMAL LOW (ref 8.9–10.3)
GFR calc Af Amer: 45 mL/min — ABNORMAL LOW (ref >60)
GFR calc non Af Amer: 39 mL/min — ABNORMAL LOW (ref >60)
Glucose, Bld: 241 mg/dL — ABNORMAL HIGH (ref 70–99)
POTASSIUM: 3.7 mmol/L (ref 3.5–5.1)
SODIUM: 138 mmol/L (ref 135–145)

## 2018-02-04 LAB — HEMOGLOBIN A1C
HEMOGLOBIN A1C: 8.3 % — AB (ref 4.8–5.6)
Mean Plasma Glucose: 191.51 mg/dL

## 2018-02-04 LAB — URIC ACID: Uric Acid, Serum: 8 mg/dL — ABNORMAL HIGH (ref 2.5–7.1)

## 2018-02-04 LAB — MAGNESIUM: MAGNESIUM: 1.7 mg/dL (ref 1.7–2.4)

## 2018-02-04 MED ORDER — IOPAMIDOL (ISOVUE-370) INJECTION 76%
100.0000 mL | Freq: Once | INTRAVENOUS | Status: AC | PRN
  Administered 2018-02-04: 75 mL via INTRAVENOUS

## 2018-02-04 MED ORDER — GABAPENTIN 100 MG PO CAPS
200.0000 mg | ORAL_CAPSULE | Freq: Two times a day (BID) | ORAL | Status: DC
  Administered 2018-02-04 – 2018-02-10 (×13): 200 mg via ORAL
  Filled 2018-02-04 (×13): qty 2

## 2018-02-04 MED ORDER — ATORVASTATIN CALCIUM 40 MG PO TABS
40.0000 mg | ORAL_TABLET | Freq: Every day | ORAL | Status: DC
  Administered 2018-02-04 – 2018-02-09 (×6): 40 mg via ORAL
  Filled 2018-02-04 (×8): qty 1

## 2018-02-04 MED ORDER — PERFLUTREN LIPID MICROSPHERE
1.0000 mL | INTRAVENOUS | Status: AC | PRN
  Administered 2018-02-04: 2 mL via INTRAVENOUS
  Filled 2018-02-04: qty 10

## 2018-02-04 MED ORDER — GABAPENTIN 100 MG PO CAPS: 200.0000 mg | ORAL_CAPSULE | Freq: Two times a day (BID) | ORAL | Status: DC

## 2018-02-04 MED ORDER — INSULIN GLARGINE 100 UNIT/ML ~~LOC~~ SOLN
24.0000 [IU] | Freq: Every day | SUBCUTANEOUS | Status: DC
  Administered 2018-02-04 – 2018-02-09 (×6): 24 [IU] via SUBCUTANEOUS
  Filled 2018-02-04 (×7): qty 0.24

## 2018-02-04 MED ORDER — ENOXAPARIN SODIUM 80 MG/0.8ML ~~LOC~~ SOLN
75.0000 mg | Freq: Every day | SUBCUTANEOUS | Status: DC
  Administered 2018-02-04 – 2018-02-09 (×6): 75 mg via SUBCUTANEOUS
  Filled 2018-02-04 (×6): qty 0.8

## 2018-02-04 MED ORDER — PNEUMOCOCCAL VAC POLYVALENT 25 MCG/0.5ML IJ INJ
0.5000 mL | INJECTION | INTRAMUSCULAR | Status: DC
  Filled 2018-02-04: qty 0.5

## 2018-02-04 MED ORDER — INFLUENZA VAC SPLIT HIGH-DOSE 0.5 ML IM SUSY
0.5000 mL | PREFILLED_SYRINGE | INTRAMUSCULAR | Status: DC
  Filled 2018-02-04: qty 0.5

## 2018-02-04 NOTE — Evaluation (Signed)
Physical Therapy Evaluation Patient Details Name: Bianca Barnett MRN: [id number] DOB: 12-19-1940 Today's Date: 02/04/2018   History of Present Illness  Pt is a 77 y.o female with a PMH consisting of vertigo, type 2 diabetes, hypertension, morbid obesity, anxiety, ovarian cancer, night time palpitations and chronic neck pain. Pt reports to ED with L sided facial droop with slurred speech, and LE swelling. Pt also reports numbness in bilateral hands. CT of the head on 02/03/2018 shows low-density in the L insular cortical and subcortical areas with evidence of hemorrhage or mass effect. Pt unable to undergo MRI brain due to body habitus.  Clinical Impression  Pt presented supine, HOB elevated and alert. Pt was willing to participate in PT. Prior to admission, pt ambulated with two canes. Pt states increasing difficulty with ADL's over the past month, and now requires sponge baths and ambulation difficulty. Pt required overall max A +2-3 for bed mobility. Pt experienced severe nausea and vertigo like symptoms when transitioning for supine to sitting with no nystagmus observed. During sitting, pt required 2 person HHA to maintain balance. After 5 minutes of sitting, vertigo symptoms remained with no decrease of intensity. Pt requested to be laid back into bed. Pt would benefit from skilled PT in order to increase strength and functional mobility, and decreasing skin breakdown through positioning.       Follow Up Recommendations SNF    Equipment Recommendations  None recommended by PT    Recommendations for Other Services       Precautions / Restrictions Precautions Precautions: Fall Restrictions Weight Bearing Restrictions: No      Mobility  Bed Mobility Overal bed mobility: Needs Assistance Bed Mobility: Rolling;Supine to Sit;Sit to Supine Rolling: +2 for physical assistance;Max assist   Supine to sit: Max assist;+2 for physical assistance;HOB elevated Sit to supine: +2 for physical  assistance;Max assist   General bed mobility comments: Increased time and effort needed to get from supine to sitting and sitting to supine. Pt required assistance with BLE management off of and onto bed. Further assistance needed for trunk elevation. Pt used BUE to assist with movement. Verbal cueing given for hand placement. Granddaughter was eager to assist.       Transfers                 General transfer comment: Deferred  Ambulation/Gait                Stairs            Wheelchair Mobility    Modified Rankin (Stroke Patients Only)       Balance Overall balance assessment: Needs assistance Sitting-balance support: Feet supported;Bilateral upper extremity supported Sitting balance-Leahy Scale: Poor Sitting balance - Comments: Pt required 2 person HHA for UE support, as well as BLE support on the ground. Pt reported continued nausea and vertigo like symptoms throughout sitting. Pt also required trunk control initially to maintain upright sitting posture.                                            Pertinent Vitals/Pain Pain Assessment: Faces Faces Pain Scale: Hurts even more Pain Location: RLE, Neck  Pain Descriptors / Indicators: Tingling;Shooting;Sharp Pain Intervention(s): Limited activity within patient's tolerance;Monitored during session    Home Living Family/patient expects to be discharged to:: Private residence Living Arrangements: Other relatives Available Help at Discharge: Family;Friend(s);Available 24  hours/day(CNA 5 days a week 2 hrs for bathing/dressing) Type of Home: House Home Access: Level entry     Home Layout: Two level Home Equipment: Hospital bed;Cane - quad(2 canes, bed trapeze)      Prior Function Level of Independence: Needs assistance   Gait / Transfers Assistance Needed: 2 Canes   ADL's / Homemaking Assistance Needed: requires sponge baths and assistance with dressing     Comments: Pt reports  progressively worsening symptoms that has caused her level of function to decrease. Pt was ambulating with two canes. Now needing assistance with sponge baths. Pt unable to find appropriate walker.     Hand Dominance        Extremity/Trunk Assessment   Upper Extremity Assessment Upper Extremity Assessment: Defer to OT evaluation    Lower Extremity Assessment Lower Extremity Assessment: Generalized weakness(Difficult to asses secondary to vertigo symptoms )       Communication   Communication: No difficulties  Cognition Arousal/Alertness: Awake/alert Behavior During Therapy: WFL for tasks assessed/performed;Anxious Overall Cognitive Status: Within Functional Limits for tasks assessed                                 General Comments: Pt displayed increased anxiousness during supine to sit activity, secondary to vertigo/increased family presence in the room.          General Comments General comments (skin integrity, edema, etc.): Pt states BLE swelling in feet, increased compared to baseline            Exercises     Assessment/Plan    PT Assessment Patient needs continued PT services  PT Problem List Decreased strength;Decreased balance;Decreased range of motion;Decreased activity tolerance;Decreased mobility;Decreased coordination;Decreased knowledge of use of DME;Decreased safety awareness       PT Treatment Interventions DME instruction;Gait training;Stair training;Functional mobility training;Therapeutic activities;Therapeutic exercise;Balance training;Neuromuscular re-education;Patient/family education    PT Goals (Current goals can be found in the Care Plan section)  Acute Rehab PT Goals Patient Stated Goal: To return to previous function    PT Goal Formulation: With patient Time For Goal Achievement: 02/18/18 Potential to Achieve Goals: Fair    Frequency Min 3X/week   Barriers to discharge        Co-evaluation               AM-PAC PT  "6 Clicks" Daily Activity  Outcome Measure Difficulty turning over in bed (including adjusting bedclothes, sheets and blankets)?: Unable Difficulty moving from lying on back to sitting on the side of the bed? : Unable Difficulty sitting down on and standing up from a chair with arms (e.g., wheelchair, bedside commode, etc,.)?: Unable Help needed moving to and from a bed to chair (including a wheelchair)?: Total Help needed walking in hospital room?: Total Help needed climbing 3-5 steps with a railing? : Total 6 Click Score: 6    End of Session   Activity Tolerance: Patient limited by fatigue;Treatment limited secondary to medical complications (Comment)(vertigo symptoms ) Patient left: in bed;with call bell/phone within reach;with family/visitor present Nurse Communication: Mobility status PT Visit Diagnosis: Muscle weakness (generalized) (M62.81);Other abnormalities of gait and mobility (R26.89);Other symptoms and signs involving the nervous system (R29.898)    Time: 1610-9604 PT Time Calculation (min) (ACUTE ONLY): 26 min   Charges:   PT Evaluation $PT Eval Moderate Complexity: (P) 1 Mod PT Treatments $Therapeutic Activity: (P) 8-22 mins        Wandra Feinstein,  SPT Acute Rehab [phone] (pager) [phone] (office)   Tirrell Buchberger 02/04/2018, 12:57 PM

## 2018-02-04 NOTE — Progress Notes (Signed)
SLP Cancellation Note  Patient Details Name: Bianca Barnett MRN: [id number] DOB: 1940-08-07   Cancelled treatment:       Reason Eval/Treat Not Completed: SLP screened, no needs identified, will sign off   Peggye Poon, Katherene Ponto 02/04/2018, 2:00 PM

## 2018-02-04 NOTE — Evaluation (Addendum)
Occupational Therapy Evaluation Patient Details Name: Bianca Barnett MRN: [id number] DOB: [birth year] Today's Date: 02/04/2018    History of Present Illness Pt is a 77 y.o female with a PMH consisting of vertigo, type 2 diabetes, hypertension, morbid obesity, anxiety, ovarian cancer, night time palpitations and chronic neck pain. Pt reports to ED with L sided facial droop with slurred speech, and LE swelling. Pt also reports numbness in bilateral hands. CT of the head on 02/03/2018 shows low-density in the L insular cortical and subcortical areas with evidence of hemorrhage or mass effect. Pt unable to undergo MRI brain due to body habitus.   Clinical Impression   This 77 y/o female presents with the above. Pt reports recent decline in function at home, reports she was using 2 canes for functional mobility, receiving daily assist for bathing/dressing ADLs, was sponge bathing. Limited eval this session; pt seated EOB upon entry and had just recently used BSC, pt reporting increased nausea while sitting EOB  and declining further mobility attempts. Pt requiring maxA+2 for bed mobility to return to supine. She currently requires modA for UB ADL, max-totalA for LB ADL in sitting/at bed level. Pt will benefit from continued acute OT services and recommend follow up therapy services in SNF setting prior to return home to return pt to PLOF. Will continue to follow.     Follow Up Recommendations  SNF;Supervision/Assistance - 24 hour    Equipment Recommendations  Other (comment)(TBD in next venue)           Precautions / Restrictions Precautions Precautions: Fall Restrictions Weight Bearing Restrictions: No      Mobility Bed Mobility Overal bed mobility: Needs Assistance Bed Mobility: Sit to Supine       Sit to supine: +2 for physical assistance;Max assist   General bed mobility comments: assist for LEs onto EOB and to guide trunk when returning to supine; pt able to self assist with boosting  towards [facility]  Transfers                 General transfer comment: Deferred    Balance Overall balance assessment: Needs assistance Sitting-balance support: Feet supported;Bilateral upper extremity supported Sitting balance-Leahy Scale: Fair Sitting balance - Comments: pt sitting EOB upon entry, maintaining sitting balance this session with minguard assist                                   ADL either performed or assessed with clinical judgement   ADL Overall ADL's : Needs assistance/impaired Eating/Feeding: Set up;Sitting   Grooming: Min guard;Set up;Sitting   Upper Body Bathing: Moderate assistance;Sitting   Lower Body Bathing: Maximal assistance;+2 for physical assistance;+2 for safety/equipment;Sitting/lateral leans;Bed level   Upper Body Dressing : Moderate assistance;Sitting   Lower Body Dressing: Maximal assistance;Total assistance;+2 for physical assistance;+2 for safety/equipment;Sitting/lateral leans;Bed level                 General ADL Comments: pt seated EOB upon entering room; per RN report pt up to [facility] to attempt to have BM, pt reports increased nausea now sitting EOB and declined further mobility, required maxA+2 to return to supine, though once in supine able to use bed rail at top of bed to assist with pulling herself towards [facility]     Vision         Perception     Praxis      Pertinent Vitals/Pain Pain Assessment: Faces Faces Pain  Scale: Hurts little more Pain Location: Neck  Pain Descriptors / Indicators: Tingling;Shooting;Sharp Pain Intervention(s): Limited activity within patient's tolerance;Monitored during session     Hand Dominance     Extremity/Trunk Assessment Upper Extremity Assessment Upper Extremity Assessment: Generalized weakness;LUE deficits/detail LUE Deficits / Details: pt with L hand/wrist splint donned, reports difficulty maintaining L wrist extension as of recent LUE Coordination: decreased fine  motor   Lower Extremity Assessment Lower Extremity Assessment: Defer to PT evaluation       Communication Communication Communication: No difficulties   Cognition Arousal/Alertness: Awake/alert Behavior During Therapy: WFL for tasks assessed/performed Overall Cognitive Status: Within Functional Limits for tasks assessed                                     General Comments  pt with increased feelings of dizziness and feeling that she is spinning upon return to supine, reports she has vertigo-like symptoms at baseline with functional transitions    Exercises     Shoulder Instructions      Home Living Family/patient expects to be discharged to:: Private residence Living Arrangements: Other relatives Available Help at Discharge: Family;Friend(s);Available 24 hours/day(CNA 5 days a week 2 hrs for bathing/dressing) Type of Home: House Home Access: Level entry     Home Layout: Two level Alternate Level Stairs-Number of Steps: (Pt stays on first floor )   Bathroom Shower/Tub: Tub/shower unit(Sponge baths currently )   Biochemist, clinical: Standard Bathroom Accessibility: Yes   Home Equipment: Hospital bed;Cane - quad;Bedside commode(2 canes)          Prior Functioning/Environment Level of Independence: Needs assistance  Gait / Transfers Assistance Needed: 2 Canes  ADL's / Homemaking Assistance Needed: requires sponge baths and assistance with dressing; has an aide who assists with ADLs 5 days/wk for 2hrs/day    Comments: Pt reports progressively worsening symptoms that has caused her level of function to decrease. Pt was ambulating with two canes. Now needing assistance with sponge baths. Pt unable to find appropriate walker.        OT Problem List: Decreased strength;Decreased range of motion;Decreased activity tolerance;Impaired balance (sitting and/or standing);Obesity      OT Treatment/Interventions: Self-care/ADL training;Therapeutic  exercise;Therapeutic activities;Patient/family education;Balance training;DME and/or AE instruction;Energy conservation    OT Goals(Current goals can be found in the care plan section) Acute Rehab OT Goals Patient Stated Goal: To return to previous function    OT Goal Formulation: With patient Time For Goal Achievement: 02/18/18 Potential to Achieve Goals: Fair  OT Frequency: Min 2X/week   Barriers to D/C:            Co-evaluation              AM-PAC PT "6 Clicks" Daily Activity     Outcome Measure Help from another person eating meals?: None Help from another person taking care of personal grooming?: A Little Help from another person toileting, which includes using toliet, bedpan, or urinal?: A Lot Help from another person bathing (including washing, rinsing, drying)?: A Lot Help from another person to put on and taking off regular upper body clothing?: A Lot Help from another person to put on and taking off regular lower body clothing?: Total 6 Click Score: 14   End of Session Nurse Communication: Mobility status  Activity Tolerance: Patient limited by fatigue;Other (comment)(limited due to nausea) Patient left: in bed;with call bell/phone within reach;with family/visitor present  OT Visit  Diagnosis: Muscle weakness (generalized) (M62.81)                Time: 9191-6606 OT Time Calculation (min): 12 min Charges:  OT General Charges $OT Visit: 1 Visit OT Evaluation $OT Eval Moderate Complexity: [place], OT [employer] Pager [phone] Office [phone]   Raymondo Band 02/04/2018, 4:07 PM

## 2018-02-04 NOTE — NC FL2 (Signed)
[place] MEDICAID FL2 LEVEL OF CARE SCREENING TOOL     IDENTIFICATION  Patient Name: Bianca Barnett Birthdate: [birth year] Sex: female Admission Date (Current Location): 02/03/2018  [facility] and [place] Number:  Herbalist and Address:  [facility] [place], [place] [address], [place], [place] [address]      Provider Number: [id number]  Attending Physician Name and Address:  Mendel Corning, MD  Relative Name and Phone Number:       Current Level of Care: Hospital Recommended Level of Care: [place] Prior Approval Number:    Date Approved/Denied:   PASRR Number: [id number]  Discharge Plan: SNF    Current Diagnoses: Patient Active Problem List   Diagnosis Date Noted  . Peripheral edema 02/04/2018  . TIA (transient ischemic attack) 02/03/2018  . Passed out 07/04/2015  . Abnormality of gait 07/04/2015  . Vertigo 07/04/2015  . New onset of headaches, left frontal 03/07/2011  . Changes in vision, bluriness 03/07/2011  . DM (diabetes mellitus) ([place]) 11/03/2006  . MORBID OBESITY 11/03/2006  . Hypertension associated with diabetes ([place]) 11/03/2006  . OSTEOARTHRITIS 11/03/2006  . HYSTERECTOMY, HX OF 11/03/2006  . ARTHROSCOPY, KNEE, HX OF 11/03/2006    Orientation RESPIRATION BLADDER Height & Weight     Self, Time, Situation, Place  Normal Continent Weight: (!) 344 lb (156 kg) Height:  5' (152.4 cm)  BEHAVIORAL SYMPTOMS/MOOD NEUROLOGICAL BOWEL NUTRITION STATUS      Continent Diet(carb modified, heart healthy)  AMBULATORY STATUS COMMUNICATION OF NEEDS Skin   Extensive Assist Verbally Normal                       Personal Care Assistance Level of Assistance  Bathing, Feeding, Dressing Bathing Assistance: Maximum assistance Feeding assistance: Limited assistance Dressing Assistance: Maximum assistance     Functional Limitations Info  Sight, Hearing, Speech Sight Info: Adequate Hearing Info: Adequate Speech Info: Adequate     SPECIAL CARE FACTORS FREQUENCY  PT (By licensed PT), OT (By licensed OT)     PT Frequency: 5x/wk OT Frequency: 5x/wk            Contractures Contractures Info: Not present    Additional Factors Info  Code Status, Allergies, Insulin Sliding Scale Code Status Info: DNR Allergies Info: Adhesive Tape, Aspirin, Codeine, Latex, Lisinopril, Penicillins, Procaine   Insulin Sliding Scale Info: Novolog 3x/day with meals; Lantus 24 units daily at bed       Current Medications (02/04/2018):  This is the current hospital active medication list Current Facility-Administered Medications  Medication Dose Route Frequency Provider Last Rate Last Dose  . acetaminophen (TYLENOL) tablet 650 mg  650 mg Oral Q6H PRN Lenore Cordia, MD       Or  . acetaminophen (TYLENOL) suppository 650 mg  650 mg Rectal Q6H PRN Zada Finders R, MD      . albuterol (PROVENTIL) (2.5 MG/3ML) 0.083% nebulizer solution 3 mL  3 mL Inhalation Q6H PRN Lenore Cordia, MD      . aspirin EC tablet 81 mg  81 mg Oral Daily Zada Finders R, MD   81 mg at 02/04/18 1043  . atorvastatin (LIPITOR) tablet 40 mg  40 mg Oral q1800 Zada Finders R, MD      . enoxaparin (LOVENOX) injection 75 mg  75 mg Subcutaneous QHS Robertson, Crystal S, RPH      . furosemide (LASIX) injection 40 mg  40 mg Intravenous BID Lenore Cordia, MD  40 mg at 02/04/18 0813  . gabapentin (NEURONTIN) capsule 200 mg  200 mg Oral BID Rai, Ripudeep K, MD   200 mg at 02/04/18 1434  . hydrOXYzine (ATARAX/VISTARIL) tablet 50 mg  50 mg Oral QHS Lenore Cordia, MD      . Derrill Memo ON 02/05/2018] Influenza vac split quadrivalent PF (FLUZONE HIGH-DOSE) injection 0.5 mL  0.5 mL Intramuscular Tomorrow-1000 Patel, Vishal R, MD      . insulin aspart (novoLOG) injection 0-15 Units  0-15 Units Subcutaneous TID WC Lenore Cordia, MD   3 Units at 02/04/18 1147  . insulin glargine (LANTUS) injection 24 Units  24 Units Subcutaneous QHS Rai, Ripudeep K, MD      . irbesartan  (AVAPRO) tablet 150 mg  150 mg Oral Daily Zada Finders R, MD   150 mg at 02/04/18 1043  . [START ON 02/05/2018] pneumococcal 23 valent vaccine (PNU-IMMUNE) injection 0.5 mL  0.5 mL Intramuscular Tomorrow-1000 Patel, Vishal R, MD      . senna-docusate (Senokot-S) tablet 1 tablet  1 tablet Oral QHS PRN Zada Finders R, MD      . sodium chloride flush (NS) 0.9 % injection 3 mL  3 mL Intravenous Q12H Lenore Cordia, MD   3 mL at 02/04/18 0345     Discharge Medications: Please see discharge summary for a list of discharge medications.  Relevant Imaging Results:  Relevant Lab Results:   Additional Information SS#: [id number]  Geralynn Ochs, LCSW

## 2018-02-04 NOTE — Progress Notes (Signed)
  Echocardiogram 2D Echocardiogram has been performed.  Bianca Barnett 02/04/2018, 11:22 AM

## 2018-02-04 NOTE — Progress Notes (Signed)
PT Progress Note for Charges    02/04/18 1200  PT General Charges  $$ ACUTE PT VISIT 1 Visit  PT Evaluation  $PT Eval Moderate Complexity 1 Mod  PT Treatments  $Therapeutic Activity 8-22 mins  Sherie Don, PT, DPT  Acute Rehabilitation Services Pager [phone] Office [phone]

## 2018-02-04 NOTE — Clinical Social Work Note (Signed)
Clinical Social Work Assessment  Patient Details  Name: Bianca Barnett MRN: [id number] Date of Birth: 02/23/1941  Date of referral:  02/04/18               Reason for consult:  Facility Placement                Permission sought to share information with:  Facility Art therapist granted to share information::  Yes, Verbal Permission Granted  Name::        Agency::  SNF  Relationship::     Contact Information:     Housing/Transportation Living arrangements for the past 2 months:  Single Family Home Source of Information:  Patient Patient Interpreter Needed:  None Criminal Activity/Legal Involvement Pertinent to Current Situation/Hospitalization:  No - Comment as needed Significant Relationships:  Adult Children Lives with:  Self, Adult Children Do you feel safe going back to the place where you live?  Yes Need for family participation in patient care:  No (Coment)  Care giving concerns:  Patient from home with her daughter who lives with her, but will benefit from short term rehab at discharge.    Social Worker assessment / plan:  CSW met with patient to discuss recommendation for SNF. Patient said she was unfamiliar with what that meant, and asked CSW to explain. CSW explained expectations and duration of stay. Patient agreeable with going, says she wants to be able to move better and do more things for herself again. Patient would like to be close to where her family is on [address], because they don't drive and they are her support network. CSW to fax out referral and follow with facility options.  Employment status:  Retired Nurse, adult PT Recommendations:  [place] / Referral to community resources:  [place]  Patient/Family's Response to care:  Patient agreeable to SNF placement.  Patient/Family's Understanding of and Emotional Response to Diagnosis, Current Treatment, and  Prognosis:  Patient discussed how she usually is able to get herself around using her two canes and she drives around on a scooter, but she's been struggling lately and hasn't been able to do as much. Patient would like to get well and be able to do things for herself again.   Emotional Assessment Appearance:  Appears stated age Attitude/Demeanor/Rapport:  Engaged Affect (typically observed):  Pleasant Orientation:  Oriented to Self, Oriented to Place, Oriented to  Time, Oriented to Situation Alcohol / Substance use:  Not Applicable Psych involvement (Current and /or in the community):  No (Comment)  Discharge Needs  Concerns to be addressed:  Care Coordination Readmission within the last 30 days:  No Current discharge risk:  Physical Impairment, Dependent with Mobility Barriers to Discharge:  Continued Medical Work up, [place], [place] 02/04/2018, 4:06 PM

## 2018-02-04 NOTE — Progress Notes (Signed)
Orthopedic Tech Progress Note Patient Details:  Bianca Barnett 26-Sep-1940 [id number]  Ortho Devices Type of Ortho Device: Wrist splint Ortho Device/Splint Interventions: Application   Post Interventions Patient Tolerated: Well   Melony Overly T 02/04/2018, 2:03 PM

## 2018-02-04 NOTE — Progress Notes (Addendum)
[facility]                                                                              Patient Demographics  Bianca Barnett, is a 77 y.o. female, DOB - [birth year], FTD:[id number]  Admit date - 02/03/2018   Admitting Physician Lenore Cordia, MD  Outpatient Primary MD for the patient is No primary care provider on file.  Outpatient specialists:   LOS - 0  days   Medical records reviewed and are as summarized below:    Chief Complaint  Patient presents with  . Weakness       Brief summary   Patient is a 77 year old female with type 2 diabetes, hypertension, hyperlipidemia, morbid obesity presented with lower extremity swelling over the last 2 weeks, no shortness of breath but wakes up in the middle of the night with palpitations.  No chest pain, orthopnea or PND.  She reported drinking a lot of fluids.  She experienced an episode of left-sided facial droop day prior to admission with some slurred speech, symptoms lasted 1 to 1.5 hours and resolved.  Patient returned back to her baseline.  Patient was admitted for further work-up.   Assessment & Plan    Principal Problem:   TIA (transient ischemic attack) -Suspected to have TIA with transient left facial droop and slurred speech however was not able to undergo MRI brain due to body habitus.  Currently left facial droop and slurred speech resolved. -Neurology was consulted in the ED who recommended CT angiogram of the head and neck which showed no emergent large vessel occlusion or other acute abnormality, 50% stenosis of the proximal left ICA secondary to calcific atherosclerosis. -Continue aspirin, statin -LDL 107, continue statin Hemoglobin A1c 8.3 PT OT evaluation pending, 2D echo results pending  Active Problems: Peripheral edema in the last 2 weeks -Follow 2D echo, continue IV Lasix Follow strict I's and O's and daily weights    DM (diabetes mellitus) ([place]) -Hemoglobin A1c 8.3, continue Lantus and  sliding scale insulin    Hypertension associated with diabetes ([place]) -Continue irbesartan, Lasix  carpal tunnel syndrome, left hand -Left hand, increase Neurontin to 200 mg twice a day Will check x-ray of the left hand, uric acid, magnesium.  Calcium and potassium currently stable Will place wrist splint if that would help  Acromioclavicular arthropathy Continue pain control, PT  Morbid obesity BMI 67.1, counseled on diet and weight control  Code Status: DNR DVT Prophylaxis: Lovenox Family Communication: Discussed in detail with the patient, all imaging results, lab results explained to the patient and granddaughter   Disposition Plan: Possible DC home in a.m.  Time Spent in minutes   35 minutes  Procedures:  CT angiogram head and neck  Consultants:   None  Antimicrobials:      Medications  Scheduled Meds: . aspirin EC  81 mg Oral Daily  . atorvastatin  40 mg Oral q1800  . enoxaparin (LOVENOX) injection  40 mg Subcutaneous Q24H  . furosemide  40 mg Intravenous BID  . gabapentin  200 mg Oral QHS  . hydrOXYzine  50 mg Oral QHS  . [START ON 02/05/2018]  Influenza vac split quadrivalent PF  0.5 mL Intramuscular Tomorrow-1000  . insulin aspart  0-15 Units Subcutaneous TID WC  . insulin glargine  24 Units Subcutaneous QHS  . irbesartan  150 mg Oral Daily  . [START ON 02/05/2018] pneumococcal 23 valent vaccine  0.5 mL Intramuscular Tomorrow-1000  . sodium chloride flush  3 mL Intravenous Q12H   Continuous Infusions: PRN Meds:.acetaminophen **OR** acetaminophen, albuterol, senna-docusate   Antibiotics   Anti-infectives (From admission, onward)   None        Subjective:   Bianca Barnett was seen and examined today.  No acute complaints. Patient denies dizziness, chest pain, shortness of breath, abdominal pain, N/V/D/C.  Objective:   Vitals:   02/03/18 2230 02/03/18 2318 02/04/18 0756 02/04/18 1209  BP: (!) 150/79 (!) 142/104 (!) 157/93 (!) 155/53  Pulse: 78  72 73 74  Resp: 15 15 18 18   Temp:  98 F (36.7 C) 97.9 F (36.6 C) 97.7 F (36.5 C)  TempSrc:  Oral Oral Oral  SpO2: 95% 100% 99% 97%  Weight:      Height:       No intake or output data in the 24 hours ending 02/04/18 1217   Wt Readings from Last 3 Encounters:  02/03/18 (!) 156 kg  08/25/16 (!) 157.4 kg  08/09/15 (!) 155.1 kg     Exam  General: Alert and oriented x 3, NAD  Eyes:   HEENT:    Cardiovascular: S1 S2 auscultated, Regular rate and rhythm.  Respiratory: Clear to auscultation bilaterally, no wheezing, rales or rhonchi  Gastrointestinal: Morbidly obese, soft, nontender, nondistended, + bowel sounds  Ext: no pedal edema bilaterally  Neuro: no new deficits, no slurred speech or facial drooping  Musculoskeletal: No digital cyanosis, clubbing  Skin: No rashes  Psych: Normal affect and demeanor, alert and oriented x3    Data Reviewed:  I have personally reviewed following labs and imaging studies  Micro Results No results found for this or any previous visit (from the past 240 hour(s)).  Radiology Reports Ct Angio Head W/cm &/or Wo Cm  Result Date: 02/04/2018 CLINICAL DATA:  Facial weakness EXAM: CT ANGIOGRAPHY HEAD AND NECK TECHNIQUE: Multidetector CT imaging of the head and neck was performed using the standard protocol during bolus administration of intravenous contrast. Multiplanar CT image reconstructions and MIPs were obtained to evaluate the vascular anatomy. Carotid stenosis measurements (when applicable) are obtained utilizing NASCET criteria, using the distal internal carotid diameter as the denominator. CONTRAST:  73mL ISOVUE-370 IOPAMIDOL (ISOVUE-370) INJECTION 76% COMPARISON:  Head CT 02/03/2018 FINDINGS: CT HEAD FINDINGS Brain: There is no mass, hemorrhage or extra-axial collection. The size and configuration of the ventricles and extra-axial CSF spaces are normal. There is no acute or chronic infarction. There is hypoattenuation of the  periventricular white matter, most commonly indicating chronic ischemic microangiopathy. Skull: The visualized skull base, calvarium and extracranial soft tissues are normal. Sinuses/Orbits: No fluid levels or advanced mucosal thickening of the visualized paranasal sinuses. No mastoid or middle ear effusion. The orbits are normal. CTA NECK FINDINGS SKELETON: There is no bony spinal canal stenosis. No lytic or blastic lesion. OTHER NECK: Normal pharynx, larynx and major salivary glands. No cervical lymphadenopathy. Unremarkable thyroid gland. UPPER CHEST: No pneumothorax or pleural effusion. No nodules or masses. AORTIC ARCH: There is mild calcific atherosclerosis of the aortic arch. There is no aneurysm, dissection or hemodynamically significant stenosis of the visualized ascending aorta and aortic arch. Conventional 3 vessel aortic branching pattern. The visualized  proximal subclavian arteries are widely patent. RIGHT CAROTID SYSTEM: --Common carotid artery: Widely patent origin without common carotid artery dissection or aneurysm. --Internal carotid artery: No dissection, occlusion or aneurysm. There is calcific atherosclerosis extending into the proximal ICA, resulting in less than 50% stenosis. Retropharyngeal course. --External carotid artery: No acute abnormality. LEFT CAROTID SYSTEM: --Common carotid artery: Widely patent origin without common carotid artery dissection or aneurysm. --Internal carotid artery: No dissection, occlusion or aneurysm. There is calcific atherosclerosis extending into the proximal ICA, resulting in 50% stenosis. Retropharyngeal course. --External carotid artery: No acute abnormality. VERTEBRAL ARTERIES: Right dominant configuration. Limited visualization of the origins. No dissection, occlusion or flow-limiting stenosis to the vertebrobasilar confluence. CTA HEAD FINDINGS ANTERIOR CIRCULATION: --Intracranial internal carotid arteries: Atherosclerotic calcification of the internal  carotid arteries at the skull base without hemodynamically significant stenosis. --Anterior cerebral arteries: Normal. Both A1 segments are present. Patent anterior communicating artery. --Middle cerebral arteries: Normal. --Posterior communicating arteries: Absent bilaterally. POSTERIOR CIRCULATION: --Basilar artery: Normal. --Posterior cerebral arteries: Normal. --Superior cerebellar arteries: Normal. --Inferior cerebellar arteries: Normal anterior and posterior inferior cerebellar arteries. VENOUS SINUSES: As permitted by contrast timing, patent. ANATOMIC VARIANTS: None DELAYED PHASE: No parenchymal contrast enhancement. Review of the MIP images confirms the above findings. IMPRESSION: 1. No emergent large vessel occlusion or other acute abnormality. 2. 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 3.  Aortic atherosclerosis (ICD10-I70.0). Electronically Signed   By: Ulyses Jarred M.D.   On: 02/04/2018 01:19   Dg Chest 2 View  Result Date: 02/03/2018 CLINICAL DATA:  Diffuse body swelling and tachycardia. Wheezing for 2 months. EXAM: CHEST - 2 VIEW COMPARISON:  07/26/2015 chest radiograph FINDINGS: Cardiomegaly and pulmonary vascular congestion noted. There is no evidence of focal airspace disease, pulmonary edema, suspicious pulmonary nodule/mass, pleural effusion, or pneumothorax. No acute bony abnormalities are identified. IMPRESSION: Cardiomegaly with pulmonary vascular congestion. Electronically Signed   By: Margarette Canada M.D.   On: 02/03/2018 16:07   Ct Head Wo Contrast  Result Date: 02/03/2018 CLINICAL DATA:  Weakness over the last week. Facial droop beginning yesterday. EXAM: CT HEAD WITHOUT CONTRAST TECHNIQUE: Contiguous axial images were obtained from the base of the skull through the vertex without intravenous contrast. COMPARISON:  Brain MRI some 07/2012 FINDINGS: Brain: There is some area of cortical and subcortical low density in the insular  region on the left that appears more prominent than was seen on previous studies. There could be recent infarction in that area. The remainder the brain shows atrophy and chronic small-vessel ischemic change of the white matter. No mass lesion, hemorrhage, hydrocephalus or extra-axial collection. Vascular: There is atherosclerotic calcification of the major vessels at the base of the brain. Skull: Negative Sinuses/Orbits: Clear/normal Other: None IMPRESSION: There is some low-density in the insular cortical and subcortical brain on the left that appears more prominent than on previous studies, raising the possibility of extension of an infarction in the left insular region. Evidence of hemorrhage or mass effect. Atrophy and chronic small-vessel ischemic changes elsewhere. Electronically Signed   By: Nelson Chimes M.D.   On: 02/03/2018 15:47   Ct Angio Neck W Or Wo Contrast  Result Date: 02/04/2018 CLINICAL DATA:  Facial weakness EXAM: CT ANGIOGRAPHY HEAD AND NECK TECHNIQUE: Multidetector CT imaging of the head and neck was performed using the standard protocol during bolus administration of intravenous contrast. Multiplanar CT image reconstructions and MIPs were obtained to evaluate the vascular anatomy. Carotid stenosis measurements (when applicable) are  obtained utilizing NASCET criteria, using the distal internal carotid diameter as the denominator. CONTRAST:  67mL ISOVUE-370 IOPAMIDOL (ISOVUE-370) INJECTION 76% COMPARISON:  Head CT 02/03/2018 FINDINGS: CT HEAD FINDINGS Brain: There is no mass, hemorrhage or extra-axial collection. The size and configuration of the ventricles and extra-axial CSF spaces are normal. There is no acute or chronic infarction. There is hypoattenuation of the periventricular white matter, most commonly indicating chronic ischemic microangiopathy. Skull: The visualized skull base, calvarium and extracranial soft tissues are normal. Sinuses/Orbits: No fluid levels or advanced mucosal  thickening of the visualized paranasal sinuses. No mastoid or middle ear effusion. The orbits are normal. CTA NECK FINDINGS SKELETON: There is no bony spinal canal stenosis. No lytic or blastic lesion. OTHER NECK: Normal pharynx, larynx and major salivary glands. No cervical lymphadenopathy. Unremarkable thyroid gland. UPPER CHEST: No pneumothorax or pleural effusion. No nodules or masses. AORTIC ARCH: There is mild calcific atherosclerosis of the aortic arch. There is no aneurysm, dissection or hemodynamically significant stenosis of the visualized ascending aorta and aortic arch. Conventional 3 vessel aortic branching pattern. The visualized proximal subclavian arteries are widely patent. RIGHT CAROTID SYSTEM: --Common carotid artery: Widely patent origin without common carotid artery dissection or aneurysm. --Internal carotid artery: No dissection, occlusion or aneurysm. There is calcific atherosclerosis extending into the proximal ICA, resulting in less than 50% stenosis. Retropharyngeal course. --External carotid artery: No acute abnormality. LEFT CAROTID SYSTEM: --Common carotid artery: Widely patent origin without common carotid artery dissection or aneurysm. --Internal carotid artery: No dissection, occlusion or aneurysm. There is calcific atherosclerosis extending into the proximal ICA, resulting in 50% stenosis. Retropharyngeal course. --External carotid artery: No acute abnormality. VERTEBRAL ARTERIES: Right dominant configuration. Limited visualization of the origins. No dissection, occlusion or flow-limiting stenosis to the vertebrobasilar confluence. CTA HEAD FINDINGS ANTERIOR CIRCULATION: --Intracranial internal carotid arteries: Atherosclerotic calcification of the internal carotid arteries at the skull base without hemodynamically significant stenosis. --Anterior cerebral arteries: Normal. Both A1 segments are present. Patent anterior communicating artery. --Middle cerebral arteries: Normal.  --Posterior communicating arteries: Absent bilaterally. POSTERIOR CIRCULATION: --Basilar artery: Normal. --Posterior cerebral arteries: Normal. --Superior cerebellar arteries: Normal. --Inferior cerebellar arteries: Normal anterior and posterior inferior cerebellar arteries. VENOUS SINUSES: As permitted by contrast timing, patent. ANATOMIC VARIANTS: None DELAYED PHASE: No parenchymal contrast enhancement. Review of the MIP images confirms the above findings. IMPRESSION: 1. No emergent large vessel occlusion or other acute abnormality. 2. 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 3.  Aortic atherosclerosis (ICD10-I70.0). Electronically Signed   By: Ulyses Jarred M.D.   On: 02/04/2018 01:19    Lab Data:  CBC: Recent Labs  Lab 02/03/18 1032 02/04/18 0347  WBC 7.0 7.6  HGB 12.3 11.6*  HCT 40.8 36.4  MCV 84.0 82.4  PLT 233 161   Basic Metabolic Panel: Recent Labs  Lab 02/03/18 1032 02/04/18 0347  NA 139 138  K 3.8 3.7  CL 103 102  CO2 27 31  GLUCOSE 149* 241*  BUN 17 18  CREATININE 1.21* 1.31*  CALCIUM 9.2 8.8*   GFR: Estimated Creatinine Clearance: 52.5 mL/min (A) (by C-G formula based on SCr of 1.31 mg/dL (H)). Liver Function Tests: Recent Labs  Lab 02/03/18 1515  AST 14*  ALT 14  ALKPHOS 65  BILITOT 0.5  PROT 7.3  ALBUMIN 3.2*   No results for input(s): LIPASE, AMYLASE in the last 168 hours. No results for input(s): AMMONIA in the last 168 hours. Coagulation Profile: No results for input(s):  INR, PROTIME in the last 168 hours. Cardiac Enzymes: Recent Labs  Lab 02/03/18 1515  TROPONINI <0.03   BNP (last 3 results) No results for input(s): PROBNP in the last 8760 hours. HbA1C: Recent Labs    02/03/18 2314  HGBA1C 8.3*   CBG: Recent Labs  Lab 02/03/18 1037 02/04/18 0801 02/04/18 1128  GLUCAP 141* 184* 168*   Lipid Profile: Recent Labs    02/04/18 0347  CHOL 177  HDL 52  LDLCALC 107*  TRIG 92    CHOLHDL 3.4   Thyroid Function Tests: No results for input(s): TSH, T4TOTAL, FREET4, T3FREE, THYROIDAB in the last 72 hours. Anemia Panel: No results for input(s): VITAMINB12, FOLATE, FERRITIN, TIBC, IRON, RETICCTPCT in the last 72 hours. Urine analysis:    Component Value Date/Time   COLORURINE YELLOW 02/03/2018 1155   APPEARANCEUR CLEAR 02/03/2018 1155   LABSPEC 1.015 02/03/2018 1155   PHURINE 5.0 02/03/2018 1155   GLUCOSEU NEGATIVE 02/03/2018 1155   HGBUR SMALL (A) 02/03/2018 1155   BILIRUBINUR NEGATIVE 02/03/2018 1155   KETONESUR NEGATIVE 02/03/2018 1155   PROTEINUR 100 (A) 02/03/2018 1155   UROBILINOGEN 1.0 07/31/2013 1359   NITRITE NEGATIVE 02/03/2018 1155   LEUKOCYTESUR NEGATIVE 02/03/2018 1155     Ripudeep Rai M.D. [facility] 02/04/2018, 12:17 PM  Pager: [phone] Between 7am to 7pm - call Pager - [phone]  After 7pm go to [email or web address] - password [email or web address]  Call night coverage person covering after 7pm

## 2018-02-05 ENCOUNTER — Observation Stay (HOSPITAL_COMMUNITY): Payer: Medicare Other

## 2018-02-05 DIAGNOSIS — J810 Acute pulmonary edema: Secondary | ICD-10-CM | POA: Diagnosis not present

## 2018-02-05 DIAGNOSIS — G459 Transient cerebral ischemic attack, unspecified: Secondary | ICD-10-CM | POA: Diagnosis not present

## 2018-02-05 DIAGNOSIS — M7989 Other specified soft tissue disorders: Secondary | ICD-10-CM | POA: Diagnosis not present

## 2018-02-05 DIAGNOSIS — E1159 Type 2 diabetes mellitus with other circulatory complications: Secondary | ICD-10-CM | POA: Diagnosis not present

## 2018-02-05 LAB — BASIC METABOLIC PANEL
Anion gap: 10 (ref 5–15)
BUN: 20 mg/dL (ref 8–23)
CHLORIDE: 100 mmol/L (ref 98–111)
CO2: 27 mmol/L (ref 22–32)
Calcium: 8.4 mg/dL — ABNORMAL LOW (ref 8.9–10.3)
Creatinine, Ser: 1.43 mg/dL — ABNORMAL HIGH (ref 0.44–1.00)
GFR calc Af Amer: 40 mL/min — ABNORMAL LOW (ref >60)
GFR calc non Af Amer: 35 mL/min — ABNORMAL LOW (ref >60)
Glucose, Bld: 171 mg/dL — ABNORMAL HIGH (ref 70–99)
POTASSIUM: 3.5 mmol/L (ref 3.5–5.1)
SODIUM: 137 mmol/L (ref 135–145)

## 2018-02-05 LAB — GLUCOSE, CAPILLARY
GLUCOSE-CAPILLARY: 148 mg/dL — AB (ref 70–99)
GLUCOSE-CAPILLARY: 160 mg/dL — AB (ref 70–99)
GLUCOSE-CAPILLARY: 197 mg/dL — AB (ref 70–99)
Glucose-Capillary: 172 mg/dL — ABNORMAL HIGH (ref 70–99)

## 2018-02-05 MED ORDER — MECLIZINE HCL 12.5 MG PO TABS
25.0000 mg | ORAL_TABLET | Freq: Three times a day (TID) | ORAL | Status: DC | PRN
  Administered 2018-02-08: 25 mg via ORAL
  Filled 2018-02-05: qty 2

## 2018-02-05 MED ORDER — INSULIN ASPART 100 UNIT/ML ~~LOC~~ SOLN
4.0000 [IU] | Freq: Three times a day (TID) | SUBCUTANEOUS | Status: DC
  Administered 2018-02-05 – 2018-02-10 (×13): 4 [IU] via SUBCUTANEOUS

## 2018-02-05 NOTE — Care Management Obs Status (Signed)
[place] NOTIFICATION   Patient Details  Name: Telissa Palmisano MRN: [id number] Date of Birth: 1941-01-23   Medicare Observation Status Notification Given:       Pollie Friar, RN 02/05/2018, 2:20 PM

## 2018-02-05 NOTE — Progress Notes (Signed)
PT Progress Note for Charges    02/05/18 1600  PT General Charges  $$ ACUTE PT VISIT 1 Visit  PT Treatments  $Therapeutic Activity 8-22 mins  Sherie Don, [place], DPT  Acute Rehabilitation Services Pager [phone] Office [phone]

## 2018-02-05 NOTE — Progress Notes (Signed)
[facility]                                                                              Patient Demographics  Bianca Barnett, is a 77 y.o. female, DOB - 10-20-1940, DUK:[id number]  Admit date - 02/03/2018   Admitting Physician Lenore Cordia, MD  Outpatient Primary MD for the patient is No primary care provider on file.  Outpatient specialists:   LOS - 0  days   Medical records reviewed and are as summarized below:    Chief Complaint  Patient presents with  . Weakness       Brief summary   Patient is a 77 year old female with type 2 diabetes, hypertension, hyperlipidemia, morbid obesity presented with lower extremity swelling over the last 2 weeks, no shortness of breath but wakes up in the middle of the night with palpitations.  No chest pain, orthopnea or PND.  She reported drinking a lot of fluids.  She experienced an episode of left-sided facial droop day prior to admission with some slurred speech, symptoms lasted 1 to 1.5 hours and resolved.  Patient returned back to her baseline.  Patient was admitted for further work-up.   Assessment & Plan    Principal Problem:   TIA (transient ischemic attack) -Suspected to have TIA with transient left facial droop and slurred speech however was not able to undergo MRI brain due to body habitus.  Currently left facial droop and slurred speech resolved. -Neurology was consulted in the ED who recommended CT angiogram of the head and neck which showed no emergent large vessel occlusion or other acute abnormality, 50% stenosis of the proximal left ICA secondary to calcific atherosclerosis. -Continue aspirin, statin -LDL 107, continue statin Hemoglobin A1c 8.3 2D echo showed EF of 55 to 60% with grade 1 diastolic dysfunction. PT OT evaluation and at skilled nursing facility, social work consulted  Active Problems: Peripheral edema in the last 2 weeks -Much improved, DC IV Lasix, creatinine starting to trend up 2D echo  showed EF of 55 to 60% with grade 1 diastolic dysfunction Follow strict I's and O's and daily weights    DM (diabetes mellitus) (HCC) -Hemoglobin A1c 8.3,  CBGs still high, continue Lantus 24 units daily at bedtime, add NovoLog meal coverage 4 units 3 times daily AC, continue sliding scale insulin      Hypertension associated with diabetes (HCC) -Continue irbesartan, Lasix  carpal tunnel syndrome, left hand -Feeling better today with the splint -Continue Neurontin to 200 mg twice a day -Left hand x-ray showed osteoarthritis  Right knee pain Likely due to osteoarthritis, patient requested x-ray  Acromioclavicular arthropathy Continue pain control, PT  Morbid obesity BMI 67.1, counseled on diet and weight control  Mild acute renal insufficiency Creatinine trended up to 1.4 today likely due to diuresis, hold Lasix today.  Code Status: DNR DVT Prophylaxis: Lovenox Family Communication: Discussed in detail with the patient, all imaging results, lab results explained to the patient    Disposition Plan: Pending skilled nursing facility  Time Spent in minutes   25 minutes  Procedures:  CT angiogram head and neck 2D echo  Consultants:  None  Antimicrobials:      Medications  Scheduled Meds: . aspirin EC  81 mg Oral Daily  . atorvastatin  40 mg Oral q1800  . enoxaparin (LOVENOX) injection  75 mg Subcutaneous QHS  . gabapentin  200 mg Oral BID  . hydrOXYzine  50 mg Oral QHS  . Influenza vac split quadrivalent PF  0.5 mL Intramuscular Tomorrow-1000  . insulin aspart  0-15 Units Subcutaneous TID WC  . insulin glargine  24 Units Subcutaneous QHS  . irbesartan  150 mg Oral Daily  . pneumococcal 23 valent vaccine  0.5 mL Intramuscular Tomorrow-1000  . sodium chloride flush  3 mL Intravenous Q12H   Continuous Infusions: PRN Meds:.acetaminophen **OR** acetaminophen, albuterol, meclizine, senna-docusate   Antibiotics   Anti-infectives (From admission, onward)    None        Subjective:   Bianca Barnett was seen and examined today.  Left hand feels better today, denies any specific complaints this morning.  . Patient denies dizziness, chest pain, shortness of breath, abdominal pain, N/V/D/C.  Objective:   Vitals:   02/04/18 1936 02/04/18 2346 02/05/18 0353 02/05/18 0749  BP: 132/61 (!) 148/49 (!) 125/58 124/89  Pulse: 74 74 64 (!) 49  Resp: 18 17 19    Temp: 98.2 F (36.8 C) 98.1 F (36.7 C) 98.1 F (36.7 C) 97.9 F (36.6 C)  TempSrc: Oral Oral Oral Oral  SpO2: 98% 98% 96% 99%  Weight:      Height:        Intake/Output Summary (Last 24 hours) at 02/05/2018 1156 Last data filed at 02/05/2018 0232 Gross per 24 hour  Intake -  Output 800 ml  Net -800 ml     Wt Readings from Last 3 Encounters:  02/03/18 (!) 156 kg  08/25/16 (!) 157.4 kg  08/09/15 (!) 155.1 kg     Exam   General: Alert and oriented x 3, NAD  Eyes:   HEENT:  Atraumatic, normocephalic,  Cardiovascular: S1 S2 auscultated, Regular rate and rhythm. No pedal edema b/l  Respiratory: CTA B  Gastrointestinal: Obese, soft, nontender, nondistended, + bowel sounds  Ext: no pedal edema bilaterally  Neuro: no new neuro deficits  Musculoskeletal: No digital cyanosis, clubbing  Skin: No rashes  Psych: Normal affect and demeanor, alert and oriented x3    Data Reviewed:  I have personally reviewed following labs and imaging studies  Micro Results No results found for this or any previous visit (from the past 240 hour(s)).  Radiology Reports Ct Angio Head W/cm &/or Wo Cm  Result Date: 02/04/2018 CLINICAL DATA:  Facial weakness EXAM: CT ANGIOGRAPHY HEAD AND NECK TECHNIQUE: Multidetector CT imaging of the head and neck was performed using the standard protocol during bolus administration of intravenous contrast. Multiplanar CT image reconstructions and MIPs were obtained to evaluate the vascular anatomy. Carotid stenosis measurements (when applicable) are  obtained utilizing NASCET criteria, using the distal internal carotid diameter as the denominator. CONTRAST:  32mL ISOVUE-370 IOPAMIDOL (ISOVUE-370) INJECTION 76% COMPARISON:  Head CT 02/03/2018 FINDINGS: CT HEAD FINDINGS Brain: There is no mass, hemorrhage or extra-axial collection. The size and configuration of the ventricles and extra-axial CSF spaces are normal. There is no acute or chronic infarction. There is hypoattenuation of the periventricular white matter, most commonly indicating chronic ischemic microangiopathy. Skull: The visualized skull base, calvarium and extracranial soft tissues are normal. Sinuses/Orbits: No fluid levels or advanced mucosal thickening of the visualized paranasal sinuses. No mastoid or middle ear effusion. The orbits are  normal. CTA NECK FINDINGS SKELETON: There is no bony spinal canal stenosis. No lytic or blastic lesion. OTHER NECK: Normal pharynx, larynx and major salivary glands. No cervical lymphadenopathy. Unremarkable thyroid gland. UPPER CHEST: No pneumothorax or pleural effusion. No nodules or masses. AORTIC ARCH: There is mild calcific atherosclerosis of the aortic arch. There is no aneurysm, dissection or hemodynamically significant stenosis of the visualized ascending aorta and aortic arch. Conventional 3 vessel aortic branching pattern. The visualized proximal subclavian arteries are widely patent. RIGHT CAROTID SYSTEM: --Common carotid artery: Widely patent origin without common carotid artery dissection or aneurysm. --Internal carotid artery: No dissection, occlusion or aneurysm. There is calcific atherosclerosis extending into the proximal ICA, resulting in less than 50% stenosis. Retropharyngeal course. --External carotid artery: No acute abnormality. LEFT CAROTID SYSTEM: --Common carotid artery: Widely patent origin without common carotid artery dissection or aneurysm. --Internal carotid artery: No dissection, occlusion or aneurysm. There is calcific  atherosclerosis extending into the proximal ICA, resulting in 50% stenosis. Retropharyngeal course. --External carotid artery: No acute abnormality. VERTEBRAL ARTERIES: Right dominant configuration. Limited visualization of the origins. No dissection, occlusion or flow-limiting stenosis to the vertebrobasilar confluence. CTA HEAD FINDINGS ANTERIOR CIRCULATION: --Intracranial internal carotid arteries: Atherosclerotic calcification of the internal carotid arteries at the skull base without hemodynamically significant stenosis. --Anterior cerebral arteries: Normal. Both A1 segments are present. Patent anterior communicating artery. --Middle cerebral arteries: Normal. --Posterior communicating arteries: Absent bilaterally. POSTERIOR CIRCULATION: --Basilar artery: Normal. --Posterior cerebral arteries: Normal. --Superior cerebellar arteries: Normal. --Inferior cerebellar arteries: Normal anterior and posterior inferior cerebellar arteries. VENOUS SINUSES: As permitted by contrast timing, patent. ANATOMIC VARIANTS: None DELAYED PHASE: No parenchymal contrast enhancement. Review of the MIP images confirms the above findings. IMPRESSION: 1. No emergent large vessel occlusion or other acute abnormality. 2. 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 3.  Aortic atherosclerosis (ICD10-I70.0). Electronically Signed   By: Ulyses Jarred M.D.   On: 02/04/2018 01:19   Dg Chest 2 View  Result Date: 02/03/2018 CLINICAL DATA:  Diffuse body swelling and tachycardia. Wheezing for 2 months. EXAM: CHEST - 2 VIEW COMPARISON:  07/26/2015 chest radiograph FINDINGS: Cardiomegaly and pulmonary vascular congestion noted. There is no evidence of focal airspace disease, pulmonary edema, suspicious pulmonary nodule/mass, pleural effusion, or pneumothorax. No acute bony abnormalities are identified. IMPRESSION: Cardiomegaly with pulmonary vascular congestion. Electronically Signed   By:  Margarette Canada M.D.   On: 02/03/2018 16:07   Dg Knee 1-2 Views Right  Result Date: 02/05/2018 CLINICAL DATA:  Swelling of the right knee for 1 month with pain, no acute abnormality. EXAM: RIGHT KNEE - 1-2 VIEW COMPARISON:  Right knee films of 01/04/2012 FINDINGS: There is little change in tricompartmental degenerative joint disease the right knee primarily involving the medial compartment where there is more loss of joint space and sclerosis present. No acute fracture is seen. There is a small amount of right knee joint fluid present. The patella appears normally positioned. IMPRESSION: 1. Tricompartmental degenerative joint disease the right knee primarily involving the medial compartment. 2. No acute fracture. 3. Small amount of fluid in the right knee joint space. Electronically Signed   By: Ivar Drape M.D.   On: 02/05/2018 11:47   Ct Head Wo Contrast  Result Date: 02/03/2018 CLINICAL DATA:  Weakness over the last week. Facial droop beginning yesterday. EXAM: CT HEAD WITHOUT CONTRAST TECHNIQUE: Contiguous axial images were obtained from the base of the skull through the vertex without intravenous contrast. COMPARISON:  Brain MRI some 07/2012 FINDINGS: Brain: There is some area of cortical and subcortical low density in the insular region on the left that appears more prominent than was seen on previous studies. There could be recent infarction in that area. The remainder the brain shows atrophy and chronic small-vessel ischemic change of the white matter. No mass lesion, hemorrhage, hydrocephalus or extra-axial collection. Vascular: There is atherosclerotic calcification of the major vessels at the base of the brain. Skull: Negative Sinuses/Orbits: Clear/normal Other: None IMPRESSION: There is some low-density in the insular cortical and subcortical brain on the left that appears more prominent than on previous studies, raising the possibility of extension of an infarction in the left insular region.  Evidence of hemorrhage or mass effect. Atrophy and chronic small-vessel ischemic changes elsewhere. Electronically Signed   By: Nelson Chimes M.D.   On: 02/03/2018 15:47   Ct Angio Neck W Or Wo Contrast  Result Date: 02/04/2018 CLINICAL DATA:  Facial weakness EXAM: CT ANGIOGRAPHY HEAD AND NECK TECHNIQUE: Multidetector CT imaging of the head and neck was performed using the standard protocol during bolus administration of intravenous contrast. Multiplanar CT image reconstructions and MIPs were obtained to evaluate the vascular anatomy. Carotid stenosis measurements (when applicable) are obtained utilizing NASCET criteria, using the distal internal carotid diameter as the denominator. CONTRAST:  68mL ISOVUE-370 IOPAMIDOL (ISOVUE-370) INJECTION 76% COMPARISON:  Head CT 02/03/2018 FINDINGS: CT HEAD FINDINGS Brain: There is no mass, hemorrhage or extra-axial collection. The size and configuration of the ventricles and extra-axial CSF spaces are normal. There is no acute or chronic infarction. There is hypoattenuation of the periventricular white matter, most commonly indicating chronic ischemic microangiopathy. Skull: The visualized skull base, calvarium and extracranial soft tissues are normal. Sinuses/Orbits: No fluid levels or advanced mucosal thickening of the visualized paranasal sinuses. No mastoid or middle ear effusion. The orbits are normal. CTA NECK FINDINGS SKELETON: There is no bony spinal canal stenosis. No lytic or blastic lesion. OTHER NECK: Normal pharynx, larynx and major salivary glands. No cervical lymphadenopathy. Unremarkable thyroid gland. UPPER CHEST: No pneumothorax or pleural effusion. No nodules or masses. AORTIC ARCH: There is mild calcific atherosclerosis of the aortic arch. There is no aneurysm, dissection or hemodynamically significant stenosis of the visualized ascending aorta and aortic arch. Conventional 3 vessel aortic branching pattern. The visualized proximal subclavian arteries are  widely patent. RIGHT CAROTID SYSTEM: --Common carotid artery: Widely patent origin without common carotid artery dissection or aneurysm. --Internal carotid artery: No dissection, occlusion or aneurysm. There is calcific atherosclerosis extending into the proximal ICA, resulting in less than 50% stenosis. Retropharyngeal course. --External carotid artery: No acute abnormality. LEFT CAROTID SYSTEM: --Common carotid artery: Widely patent origin without common carotid artery dissection or aneurysm. --Internal carotid artery: No dissection, occlusion or aneurysm. There is calcific atherosclerosis extending into the proximal ICA, resulting in 50% stenosis. Retropharyngeal course. --External carotid artery: No acute abnormality. VERTEBRAL ARTERIES: Right dominant configuration. Limited visualization of the origins. No dissection, occlusion or flow-limiting stenosis to the vertebrobasilar confluence. CTA HEAD FINDINGS ANTERIOR CIRCULATION: --Intracranial internal carotid arteries: Atherosclerotic calcification of the internal carotid arteries at the skull base without hemodynamically significant stenosis. --Anterior cerebral arteries: Normal. Both A1 segments are present. Patent anterior communicating artery. --Middle cerebral arteries: Normal. --Posterior communicating arteries: Absent bilaterally. POSTERIOR CIRCULATION: --Basilar artery: Normal. --Posterior cerebral arteries: Normal. --Superior cerebellar arteries: Normal. --Inferior cerebellar arteries: Normal anterior and posterior inferior cerebellar arteries. VENOUS SINUSES: As permitted by contrast timing, patent. ANATOMIC VARIANTS: None DELAYED PHASE:  No parenchymal contrast enhancement. Review of the MIP images confirms the above findings. IMPRESSION: 1. No emergent large vessel occlusion or other acute abnormality. 2. 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 3.  Aortic atherosclerosis (ICD10-I70.0).  Electronically Signed   By: Ulyses Jarred M.D.   On: 02/04/2018 01:19   Dg Hand 2 View Left  Result Date: 02/04/2018 CLINICAL DATA:  Hand pain and spasms EXAM: LEFT HAND - 2 VIEW COMPARISON:  Left wrist 01/04/2012 FINDINGS: Diffuse interphalangeal osteoarthritis with mild joint space narrowing and spurring diffusely. No erosion. Mild degenerative change in the radiocarpal joint. Negative for fracture. IMPRESSION: Osteoarthritis. Electronically Signed   By: Franchot Gallo M.D.   On: 02/04/2018 13:06    Lab Data:  CBC: Recent Labs  Lab 02/03/18 1032 02/04/18 0347  WBC 7.0 7.6  HGB 12.3 11.6*  HCT 40.8 36.4  MCV 84.0 82.4  PLT 233 098   Basic Metabolic Panel: Recent Labs  Lab 02/03/18 1032 02/04/18 0347 02/05/18 0323  NA 139 138 137  K 3.8 3.7 3.5  CL 103 102 100  CO2 27 31 27   GLUCOSE 149* 241* 171*  BUN 17 18 20   CREATININE 1.21* 1.31* 1.43*  CALCIUM 9.2 8.8* 8.4*  MG  --  1.7  --    GFR: Estimated Creatinine Clearance: 48.1 mL/min (A) (by C-G formula based on SCr of 1.43 mg/dL (H)). Liver Function Tests: Recent Labs  Lab 02/03/18 1515  AST 14*  ALT 14  ALKPHOS 65  BILITOT 0.5  PROT 7.3  ALBUMIN 3.2*   No results for input(s): LIPASE, AMYLASE in the last 168 hours. No results for input(s): AMMONIA in the last 168 hours. Coagulation Profile: No results for input(s): INR, PROTIME in the last 168 hours. Cardiac Enzymes: Recent Labs  Lab 02/03/18 1515  TROPONINI <0.03   BNP (last 3 results) No results for input(s): PROBNP in the last 8760 hours. HbA1C: Recent Labs    02/03/18 2314  HGBA1C 8.3*   CBG: Recent Labs  Lab 02/04/18 0801 02/04/18 1128 02/04/18 1623 02/04/18 2128 02/05/18 0641  GLUCAP 184* 168* 177* 213* 148*   Lipid Profile: Recent Labs    02/04/18 0347  CHOL 177  HDL 52  LDLCALC 107*  TRIG 92  CHOLHDL 3.4   Thyroid Function Tests: No results for input(s): TSH, T4TOTAL, FREET4, T3FREE, THYROIDAB in the last 72 hours. Anemia  Panel: No results for input(s): VITAMINB12, FOLATE, FERRITIN, TIBC, IRON, RETICCTPCT in the last 72 hours. Urine analysis:    Component Value Date/Time   COLORURINE YELLOW 02/03/2018 1155   APPEARANCEUR CLEAR 02/03/2018 1155   LABSPEC 1.015 02/03/2018 1155   PHURINE 5.0 02/03/2018 1155   GLUCOSEU NEGATIVE 02/03/2018 1155   HGBUR SMALL (A) 02/03/2018 1155   BILIRUBINUR NEGATIVE 02/03/2018 1155   KETONESUR NEGATIVE 02/03/2018 1155   PROTEINUR 100 (A) 02/03/2018 1155   UROBILINOGEN 1.0 07/31/2013 1359   NITRITE NEGATIVE 02/03/2018 1155   LEUKOCYTESUR NEGATIVE 02/03/2018 1155     Sire Poet M.D. [facility] 02/05/2018, 11:56 AM  Pager: [phone] Between 7am to 7pm - call Pager - [phone]  After 7pm go to [email or web address] - password [email or web address]  Call night coverage person covering after 7pm

## 2018-02-05 NOTE — Progress Notes (Signed)
Physical Therapy Treatment Patient Details Name: Bianca Barnett MRN: [id number] DOB: [birth year] Today's Date: 02/05/2018    History of Present Illness Pt is a 77 y.o female with a PMH consisting of vertigo, type 2 diabetes, hypertension, morbid obesity, anxiety, ovarian cancer, night time palpitations and chronic neck pain. Pt reports to ED with L sided facial droop with slurred speech, and LE swelling. Pt also reports numbness in bilateral hands. CT of the head on 02/03/2018 shows low-density in the L insular cortical and subcortical areas with evidence of hemorrhage or mass effect. Pt unable to undergo MRI brain due to body habitus.    PT Comments    Pt presented supine, HOB elevated, and alert. Pt willing to participate in PT. Pt performed  Bed mobility overall +2 Max A. Pt states continued vertigo like symptoms when transitioning from supine to sitting. Pt able to progress during therapy session by completing sit to stand and a stand pivot transfer with +2 max A. Pt remains limited secondary to increased fatigue and weakness. Pt would continue to benefit from skilled PT in order to increased strength, balance, and functional mobility.      Follow Up Recommendations  SNF     Equipment Recommendations  None recommended by PT    Recommendations for Other Services       Precautions / Restrictions Precautions Precautions: Fall Restrictions Weight Bearing Restrictions: No    Mobility  Bed Mobility Overal bed mobility: Needs Assistance Bed Mobility: Rolling;Supine to Sit Rolling: +2 for physical assistance;Max assist   Supine to sit: Max assist;+2 for physical assistance;HOB elevated     General bed mobility comments: Pt required +2 Max A for trunk elevation and BLE support. Pt able to give increased effort compared to 02/04/2018  Transfers Overall transfer level: Needs assistance   Transfers: Stand Pivot Transfers;Sit to/from Stand Sit to Stand: Max assist;+2 physical  assistance Stand pivot transfers: Max assist;+2 physical assistance       General transfer comment: Pt required +2 max assistance for sit to stand, secondary to safety. Pt needs assistance for initiating power and maintaining stability. Pt taking multiple steps during stand pivot transfer, and 2+ max A for stability. Verbal cueing given for completing turns, and tactile cueing given for correct hand placement onto bedside commode.    Ambulation/Gait                 Stairs             Wheelchair Mobility    Modified Rankin (Stroke Patients Only)       Balance Overall balance assessment: Needs assistance Sitting-balance support: Feet supported;Bilateral upper extremity supported Sitting balance-Leahy Scale: Poor Sitting balance - Comments: Pt required +2 mod/max assist during sitting balance at EOB. Pt stated vertigo like symptoms occurred during supine to sit, but a decrease from 02/04/2018.    Standing balance support: Bilateral upper extremity supported Standing balance-Leahy Scale: Poor Standing balance comment:   Pt requires +2 mod/max assist during standing to maintain stability and safety.                             Cognition Arousal/Alertness: Awake/alert Behavior During Therapy: WFL for tasks assessed/performed Overall Cognitive Status: Within Functional Limits for tasks assessed  Exercises      General Comments        Pertinent Vitals/Pain Pain Assessment: Faces Faces Pain Scale: Hurts little more Pain Location: L knee Pain Descriptors / Indicators: Aching;Discomfort Pain Intervention(s): Monitored during session    Home Living                      Prior Function            PT Goals (current goals can now be found in the care plan section) Acute Rehab PT Goals Patient Stated Goal: To return to previous function    PT Goal Formulation: With patient Time For Goal  Achievement: 02/18/18 Potential to Achieve Goals: Fair Progress towards PT goals: Progressing toward goals    Frequency    Min 3X/week      PT Plan Current plan remains appropriate    Co-evaluation              AM-PAC PT "6 Clicks" Daily Activity  Outcome Measure  Difficulty turning over in bed (including adjusting bedclothes, sheets and blankets)?: Unable Difficulty moving from lying on back to sitting on the side of the bed? : Unable Difficulty sitting down on and standing up from a chair with arms (e.g., wheelchair, bedside commode, etc,.)?: Unable Help needed moving to and from a bed to chair (including a wheelchair)?: A Lot Help needed walking in hospital room?: Total Help needed climbing 3-5 steps with a railing? : Total 6 Click Score: 7    End of Session Equipment Utilized During Treatment: Gait belt Activity Tolerance: Patient tolerated treatment well Patient left: with family/visitor present;with call bell/phone within reach(On bedside commode attempting BM) Nurse Communication: Mobility status PT Visit Diagnosis: Muscle weakness (generalized) (M62.81);Other abnormalities of gait and mobility (R26.89);Other symptoms and signs involving the nervous system (R29.898)     Time: 1103-1594 PT Time Calculation (min) (ACUTE ONLY): 17 min  Charges:  $Therapeutic Activity: 8-22 mins                     Wandra Feinstein, SPT Acute Rehab [phone] (pager) [phone] (office)   Magali Bray 02/05/2018, 4:31 PM

## 2018-02-06 DIAGNOSIS — G459 Transient cerebral ischemic attack, unspecified: Secondary | ICD-10-CM | POA: Diagnosis not present

## 2018-02-06 DIAGNOSIS — I10 Essential (primary) hypertension: Secondary | ICD-10-CM | POA: Diagnosis not present

## 2018-02-06 DIAGNOSIS — E1159 Type 2 diabetes mellitus with other circulatory complications: Secondary | ICD-10-CM | POA: Diagnosis not present

## 2018-02-06 DIAGNOSIS — J810 Acute pulmonary edema: Secondary | ICD-10-CM | POA: Diagnosis not present

## 2018-02-06 LAB — BASIC METABOLIC PANEL
ANION GAP: 7 (ref 5–15)
Anion gap: 8 (ref 5–15)
BUN: 30 mg/dL — ABNORMAL HIGH (ref 8–23)
BUN: 28 mg/dL — ABNORMAL HIGH (ref 8–23)
CALCIUM: 8.4 mg/dL — AB (ref 8.9–10.3)
CHLORIDE: 100 mmol/L (ref 98–111)
CO2: 26 mmol/L (ref 22–32)
CO2: 29 mmol/L (ref 22–32)
CREATININE: 1.81 mg/dL — AB (ref 0.44–1.00)
Calcium: 8.3 mg/dL — ABNORMAL LOW (ref 8.9–10.3)
Chloride: 98 mmol/L (ref 98–111)
Creatinine, Ser: 1.81 mg/dL — ABNORMAL HIGH (ref 0.44–1.00)
GFR calc non Af Amer: 26 mL/min — ABNORMAL LOW (ref >60)
GFR, EST AFRICAN AMERICAN: 30 mL/min — AB (ref >60)
GFR, EST AFRICAN AMERICAN: 30 mL/min — AB (ref >60)
GFR, EST NON AFRICAN AMERICAN: 26 mL/min — AB (ref >60)
GLUCOSE: 126 mg/dL — AB (ref 70–99)
Glucose, Bld: 167 mg/dL — ABNORMAL HIGH (ref 70–99)
POTASSIUM: 4.2 mmol/L (ref 3.5–5.1)
Potassium: 4 mmol/L (ref 3.5–5.1)
Sodium: 134 mmol/L — ABNORMAL LOW (ref 135–145)
Sodium: 134 mmol/L — ABNORMAL LOW (ref 135–145)

## 2018-02-06 LAB — GLUCOSE, CAPILLARY
GLUCOSE-CAPILLARY: 140 mg/dL — AB (ref 70–99)
GLUCOSE-CAPILLARY: 151 mg/dL — AB (ref 70–99)
Glucose-Capillary: 116 mg/dL — ABNORMAL HIGH (ref 70–99)
Glucose-Capillary: 146 mg/dL — ABNORMAL HIGH (ref 70–99)

## 2018-02-06 MED ORDER — SODIUM CHLORIDE 0.9 % IV SOLN
INTRAVENOUS | Status: DC
  Administered 2018-02-06 (×2): via INTRAVENOUS

## 2018-02-06 NOTE — Care Management Note (Signed)
Case Management Note  Patient Details  Name: Bianca Barnett MRN: [id number] Date of Birth: 1940-05-03  Subjective/Objective:   Pt in with TIA. She is from home with her granddaughter.  DME: canes Pt denies issues with obtaining her medications.                 Action/Plan: Plan is for pt to d/c to [facility] when medically ready. CM signing off.   Expected Discharge Date:                  Expected Discharge Plan:  Skilled Nursing Facility  In-House Referral:  Clinical Social Work  Discharge planning Services     Post Acute Care Choice:    Choice offered to:     DME Arranged:    DME Agency:     HH Arranged:    [place] Agency:     Status of Service:  In process, will continue to follow  If discussed at Long Length of Stay Meetings, dates discussed:    Additional Comments:  Pollie Friar, RN 02/06/2018, 3:33 PM

## 2018-02-06 NOTE — Progress Notes (Signed)
CSW met with patient and granddaughter at bedside, and talked to daughter on the phone. Patient's family has selected [facility] for rehab as it is the closest facility for the patient's family to get to in order to help her with her care. CSW confirmed with [facility] that they initiated authorization request this morning. CSW to follow to transition to SNF when medically stable and authorization received.  Laveda Abbe, [place] Clinical Social Worker [phone]

## 2018-02-06 NOTE — Progress Notes (Signed)
Physical Therapy Treatment Patient Details Name: Bianca Barnett MRN: [id number] DOB: Dec 04, 1940 Today's Date: 02/06/2018    History of Present Illness Pt is a 77 y.o female with a PMH consisting of vertigo, type 2 diabetes, hypertension, morbid obesity, anxiety, ovarian cancer, night time palpitations and chronic neck pain. Pt reports to ED with L sided facial droop with slurred speech, and LE swelling. Pt also reports numbness in bilateral hands. CT of the head on 02/03/2018 shows low-density in the L insular cortical and subcortical areas with evidence of hemorrhage or mass effect. Pt unable to undergo MRI brain due to body habitus.    PT Comments    Pt was in bed with granddaughter present. Pt stated she would participate in bed level exercises but didn't want to do bed mobility because she sat on EOB with her granddaughter for 3 hrs today. She stated that she would perfer PT come in the a.m. Pt was limited with supine exercises due L knee pain. Pt would benefit from continued PT in order to progress toward stated goals. Pt still seems appropriate for SNF d/c based on current functional status.   Follow Up Recommendations  SNF     Equipment Recommendations  None recommended by PT    Recommendations for Other Services       Precautions / Restrictions Precautions Precautions: Fall Restrictions Weight Bearing Restrictions: No    Mobility  Bed Mobility               General bed mobility comments: deferred because pt stated she sat on EOB for 3 hrs already today and therapy should have come earlier.  Transfers                    Ambulation/Gait                 Stairs             Wheelchair Mobility    Modified Rankin (Stroke Patients Only)       Balance                                            Cognition Arousal/Alertness: Awake/alert Behavior During Therapy: WFL for tasks assessed/performed Overall Cognitive Status:  Within Functional Limits for tasks assessed                                 General Comments: Pt refused to participate in bed mobility.       Exercises Total Joint Exercises Ankle Circles/Pumps: AROM;15 reps;Right;Left;Supine Quad Sets: AROM;10 reps;Right;Left;Supine Short Arc Quad: AROM;10 reps;Left;Right Heel Slides: AROM;10 reps;Right;Left;Other (comment)(pt reported L knee pain) Hip ABduction/ADduction: AROM;10 reps;Right;Left;Supine    General Comments        Pertinent Vitals/Pain Pain Assessment: 0-10 Pain Score: 6  Pain Location: L knee Pain Descriptors / Indicators: Aching;Discomfort Pain Intervention(s): Limited activity within patient's tolerance;Monitored during session    Home Living                      Prior Function            PT Goals (current goals can now be found in the care plan section) Acute Rehab PT Goals Patient Stated Goal: To return to previous function    PT Goal Formulation: With patient Time  For Goal Achievement: 02/18/18 Potential to Achieve Goals: Fair Progress towards PT goals: Progressing toward goals    Frequency    Min 3X/week      PT Plan Current plan remains appropriate    Co-evaluation              AM-PAC PT "6 Clicks" Daily Activity  Outcome Measure  Difficulty turning over in bed (including adjusting bedclothes, sheets and blankets)?: Unable Difficulty moving from lying on back to sitting on the side of the bed? : Unable Difficulty sitting down on and standing up from a chair with arms (e.g., wheelchair, bedside commode, etc,.)?: Unable Help needed moving to and from a bed to chair (including a wheelchair)?: A Lot Help needed walking in hospital room?: Total Help needed climbing 3-5 steps with a railing? : Total 6 Click Score: 7    End of Session   Activity Tolerance: Patient limited by fatigue;Patient limited by pain Patient left: in bed;with call bell/phone within reach;with bed  alarm set;with family/visitor present Nurse Communication: Mobility status PT Visit Diagnosis: Muscle weakness (generalized) (M62.81);Other abnormalities of gait and mobility (R26.89);Other symptoms and signs involving the nervous system (R29.898)     Time: 4970-2637 PT Time Calculation (min) (ACUTE ONLY): 27 min  Charges:  $Therapeutic Exercise: 23-37 mins                     [address], SPTA   [place] 02/06/2018, 4:29 PM

## 2018-02-06 NOTE — Progress Notes (Signed)
CSW spoke with Admissions at [facility]; continue to await authorization for SNF placement.  CSW to follow.  Laveda Abbe,  Clinical Social Worker [phone]

## 2018-02-06 NOTE — Progress Notes (Signed)
[facility]                                                                              Patient Demographics  Bianca Barnett, is a 77 y.o. female, DOB - [birth year], GXQ:[id number]  Admit date - 02/03/2018   Admitting Physician Lenore Cordia, MD  Outpatient Primary MD for the patient is No primary care provider on file.  Outpatient specialists:   LOS - 0  days   Medical records reviewed and are as summarized below:    Chief Complaint  Patient presents with  . Weakness       Brief summary   Patient is a 77 year old female with type 2 diabetes, hypertension, hyperlipidemia, morbid obesity presented with lower extremity swelling over the last 2 weeks, no shortness of breath but wakes up in the middle of the night with palpitations.  No chest pain, orthopnea or PND.  She reported drinking a lot of fluids.  She experienced an episode of left-sided facial droop day prior to admission with some slurred speech, symptoms lasted 1 to 1.5 hours and resolved.  Patient returned back to her baseline.  Patient was admitted for further work-up.   Assessment & Plan    Principal Problem:   TIA (transient ischemic attack) -Suspected to have TIA with transient left facial droop and slurred speech however was not able to undergo MRI brain due to body habitus.  Currently left facial droop and slurred speech resolved. -Neurology was consulted in the ED who recommended CT angiogram of the head and neck which showed no emergent large vessel occlusion or other acute abnormality, 50% stenosis of the proximal left ICA secondary to calcific atherosclerosis. -Continue aspirin, statin -LDL 107, continue statin Hemoglobin A1c 8.3 2D echo showed EF of 55 to 60% with grade 1 diastolic dysfunction. PT evaluation recommended SNF, social work aware  Active Problems: Peripheral edema in the last 2 weeks -Improved, IV Lasix was discontinued as creatinine started to trend up.   2D echo showed EF  of 55 to 60% with grade 1 diastolic dysfunction  Mild acute kidney injury Likely due to IV diuresis, creatinine 1.8 today, Lasix was discontinued on 11/7. Hold Avapro, placed on gentle hydration, recheck BMET later, encouraged p.o. diet    DM (diabetes mellitus) ([place]) -Hemoglobin A1c 8.3,  -CBGs fairly stable, continue Lantus, meal coverage and sliding scale insulin    Hypertension associated with diabetes ([facility]) -BP soft today, hold it irbesartan, Lasix discontinued yesterday  carpal tunnel syndrome, left hand -Feeling better today with the splint -Continue Neurontin to 200 mg twice a day -Left hand x-ray showed osteoarthritis  Right knee pain X-ray showed severe osteoarthritis  Acromioclavicular arthropathy Continue pain control, PT  Morbid obesity BMI 67.1, counseled on diet and weight control   Code Status: DNR DVT Prophylaxis: Lovenox Family Communication: Discussed in detail with the patient, all imaging results, lab results explained to the patient and granddaughter at the bedside   Disposition Plan: Pending bmet, likely to skilled nursing facility in a.m. if creatinine improved  Time Spent in minutes   25 minutes  Procedures:  CT angiogram head and neck 2D  echo  Consultants:   None  Antimicrobials:      Medications  Scheduled Meds: . aspirin EC  81 mg Oral Daily  . atorvastatin  40 mg Oral q1800  . enoxaparin (LOVENOX) injection  75 mg Subcutaneous QHS  . gabapentin  200 mg Oral BID  . hydrOXYzine  50 mg Oral QHS  . Influenza vac split quadrivalent PF  0.5 mL Intramuscular Tomorrow-1000  . insulin aspart  0-15 Units Subcutaneous TID WC  . insulin aspart  4 Units Subcutaneous TID WC  . insulin glargine  24 Units Subcutaneous QHS  . pneumococcal 23 valent vaccine  0.5 mL Intramuscular Tomorrow-1000  . sodium chloride flush  3 mL Intravenous Q12H   Continuous Infusions: . sodium chloride 100 mL/hr at 02/06/18 0932   PRN Meds:.acetaminophen **OR**  acetaminophen, albuterol, meclizine, senna-docusate   Antibiotics   Anti-infectives (From admission, onward)   None        Subjective:   Bianca Barnett was seen and examined today.  No special complaints, granddaughter at the bedside.  Per granddaughter, patient has not been eating well.  Patient denies dizziness, chest pain, shortness of breath, abdominal pain, N/V/D/C.  Objective:   Vitals:   02/06/18 0002 02/06/18 0232 02/06/18 0329 02/06/18 0821  BP: (!) 115/50  127/60 (!) 103/58  Pulse: 82  86 67  Resp: 18  18 17   Temp: 98.6 F (37 C)  97.7 F (36.5 C) 97.7 F (36.5 C)  TempSrc: Oral  Oral Oral  SpO2: 96%  97% 96%  Weight:  (!) 157.4 kg    Height:        Intake/Output Summary (Last 24 hours) at 02/06/2018 1217 Last data filed at 02/05/2018 1958 Gross per 24 hour  Intake 240 ml  Output 777 ml  Net -537 ml     Wt Readings from Last 3 Encounters:  02/06/18 (!) 157.4 kg  08/25/16 (!) 157.4 kg  08/09/15 (!) 155.1 kg     Exam  General: Alert and oriented x 3, NAD Eyes:  HEENT:  Cardiovascular: S1 S2 auscultated, RRR, No pedal edema b/l Respiratory: CTA B Gastrointestinal: Soft, nontender, nondistended, + bowel sounds Ext: no pedal edema bilaterally Neuro: no FND Musculoskeletal: No digital cyanosis, clubbing Skin: No rashes Psych: Normal affect and demeanor, alert and oriented x3    Data Reviewed:  I have personally reviewed following labs and imaging studies  Micro Results No results found for this or any previous visit (from the past 240 hour(s)).  Radiology Reports Ct Angio Head W/cm &/or Wo Cm  Result Date: 02/04/2018 CLINICAL DATA:  Facial weakness EXAM: CT ANGIOGRAPHY HEAD AND NECK TECHNIQUE: Multidetector CT imaging of the head and neck was performed using the standard protocol during bolus administration of intravenous contrast. Multiplanar CT image reconstructions and MIPs were obtained to evaluate the vascular anatomy. Carotid stenosis  measurements (when applicable) are obtained utilizing NASCET criteria, using the distal internal carotid diameter as the denominator. CONTRAST:  76mL ISOVUE-370 IOPAMIDOL (ISOVUE-370) INJECTION 76% COMPARISON:  Head CT 02/03/2018 FINDINGS: CT HEAD FINDINGS Brain: There is no mass, hemorrhage or extra-axial collection. The size and configuration of the ventricles and extra-axial CSF spaces are normal. There is no acute or chronic infarction. There is hypoattenuation of the periventricular white matter, most commonly indicating chronic ischemic microangiopathy. Skull: The visualized skull base, calvarium and extracranial soft tissues are normal. Sinuses/Orbits: No fluid levels or advanced mucosal thickening of the visualized paranasal sinuses. No mastoid or middle ear effusion. The orbits  are normal. CTA NECK FINDINGS SKELETON: There is no bony spinal canal stenosis. No lytic or blastic lesion. OTHER NECK: Normal pharynx, larynx and major salivary glands. No cervical lymphadenopathy. Unremarkable thyroid gland. UPPER CHEST: No pneumothorax or pleural effusion. No nodules or masses. AORTIC ARCH: There is mild calcific atherosclerosis of the aortic arch. There is no aneurysm, dissection or hemodynamically significant stenosis of the visualized ascending aorta and aortic arch. Conventional 3 vessel aortic branching pattern. The visualized proximal subclavian arteries are widely patent. RIGHT CAROTID SYSTEM: --Common carotid artery: Widely patent origin without common carotid artery dissection or aneurysm. --Internal carotid artery: No dissection, occlusion or aneurysm. There is calcific atherosclerosis extending into the proximal ICA, resulting in less than 50% stenosis. Retropharyngeal course. --External carotid artery: No acute abnormality. LEFT CAROTID SYSTEM: --Common carotid artery: Widely patent origin without common carotid artery dissection or aneurysm. --Internal carotid artery: No dissection, occlusion or  aneurysm. There is calcific atherosclerosis extending into the proximal ICA, resulting in 50% stenosis. Retropharyngeal course. --External carotid artery: No acute abnormality. VERTEBRAL ARTERIES: Right dominant configuration. Limited visualization of the origins. No dissection, occlusion or flow-limiting stenosis to the vertebrobasilar confluence. CTA HEAD FINDINGS ANTERIOR CIRCULATION: --Intracranial internal carotid arteries: Atherosclerotic calcification of the internal carotid arteries at the skull base without hemodynamically significant stenosis. --Anterior cerebral arteries: Normal. Both A1 segments are present. Patent anterior communicating artery. --Middle cerebral arteries: Normal. --Posterior communicating arteries: Absent bilaterally. POSTERIOR CIRCULATION: --Basilar artery: Normal. --Posterior cerebral arteries: Normal. --Superior cerebellar arteries: Normal. --Inferior cerebellar arteries: Normal anterior and posterior inferior cerebellar arteries. VENOUS SINUSES: As permitted by contrast timing, patent. ANATOMIC VARIANTS: None DELAYED PHASE: No parenchymal contrast enhancement. Review of the MIP images confirms the above findings. IMPRESSION: 1. No emergent large vessel occlusion or other acute abnormality. 2. 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 3.  Aortic atherosclerosis (ICD10-I70.0). Electronically Signed   By: Ulyses Jarred M.D.   On: 02/04/2018 01:19   Dg Chest 2 View  Result Date: 02/03/2018 CLINICAL DATA:  Diffuse body swelling and tachycardia. Wheezing for 2 months. EXAM: CHEST - 2 VIEW COMPARISON:  07/26/2015 chest radiograph FINDINGS: Cardiomegaly and pulmonary vascular congestion noted. There is no evidence of focal airspace disease, pulmonary edema, suspicious pulmonary nodule/mass, pleural effusion, or pneumothorax. No acute bony abnormalities are identified. IMPRESSION: Cardiomegaly with pulmonary vascular congestion.  Electronically Signed   By: Margarette Canada M.D.   On: 02/03/2018 16:07   Dg Knee 1-2 Views Right  Result Date: 02/05/2018 CLINICAL DATA:  Swelling of the right knee for 1 month with pain, no acute abnormality. EXAM: RIGHT KNEE - 1-2 VIEW COMPARISON:  Right knee films of 01/04/2012 FINDINGS: There is little change in tricompartmental degenerative joint disease the right knee primarily involving the medial compartment where there is more loss of joint space and sclerosis present. No acute fracture is seen. There is a small amount of right knee joint fluid present. The patella appears normally positioned. IMPRESSION: 1. Tricompartmental degenerative joint disease the right knee primarily involving the medial compartment. 2. No acute fracture. 3. Small amount of fluid in the right knee joint space. Electronically Signed   By: Ivar Drape M.D.   On: 02/05/2018 11:47   Ct Head Wo Contrast  Result Date: 02/03/2018 CLINICAL DATA:  Weakness over the last week. Facial droop beginning yesterday. EXAM: CT HEAD WITHOUT CONTRAST TECHNIQUE: Contiguous axial images were obtained from the base of the skull through the vertex without intravenous contrast. COMPARISON:  Brain MRI some 07/2012 FINDINGS: Brain: There is some area of cortical and subcortical low density in the insular region on the left that appears more prominent than was seen on previous studies. There could be recent infarction in that area. The remainder the brain shows atrophy and chronic small-vessel ischemic change of the white matter. No mass lesion, hemorrhage, hydrocephalus or extra-axial collection. Vascular: There is atherosclerotic calcification of the major vessels at the base of the brain. Skull: Negative Sinuses/Orbits: Clear/normal Other: None IMPRESSION: There is some low-density in the insular cortical and subcortical brain on the left that appears more prominent than on previous studies, raising the possibility of extension of an infarction in the  left insular region. Evidence of hemorrhage or mass effect. Atrophy and chronic small-vessel ischemic changes elsewhere. Electronically Signed   By: Nelson Chimes M.D.   On: 02/03/2018 15:47   Ct Angio Neck W Or Wo Contrast  Result Date: 02/04/2018 CLINICAL DATA:  Facial weakness EXAM: CT ANGIOGRAPHY HEAD AND NECK TECHNIQUE: Multidetector CT imaging of the head and neck was performed using the standard protocol during bolus administration of intravenous contrast. Multiplanar CT image reconstructions and MIPs were obtained to evaluate the vascular anatomy. Carotid stenosis measurements (when applicable) are obtained utilizing NASCET criteria, using the distal internal carotid diameter as the denominator. CONTRAST:  32mL ISOVUE-370 IOPAMIDOL (ISOVUE-370) INJECTION 76% COMPARISON:  Head CT 02/03/2018 FINDINGS: CT HEAD FINDINGS Brain: There is no mass, hemorrhage or extra-axial collection. The size and configuration of the ventricles and extra-axial CSF spaces are normal. There is no acute or chronic infarction. There is hypoattenuation of the periventricular white matter, most commonly indicating chronic ischemic microangiopathy. Skull: The visualized skull base, calvarium and extracranial soft tissues are normal. Sinuses/Orbits: No fluid levels or advanced mucosal thickening of the visualized paranasal sinuses. No mastoid or middle ear effusion. The orbits are normal. CTA NECK FINDINGS SKELETON: There is no bony spinal canal stenosis. No lytic or blastic lesion. OTHER NECK: Normal pharynx, larynx and major salivary glands. No cervical lymphadenopathy. Unremarkable thyroid gland. UPPER CHEST: No pneumothorax or pleural effusion. No nodules or masses. AORTIC ARCH: There is mild calcific atherosclerosis of the aortic arch. There is no aneurysm, dissection or hemodynamically significant stenosis of the visualized ascending aorta and aortic arch. Conventional 3 vessel aortic branching pattern. The visualized proximal  subclavian arteries are widely patent. RIGHT CAROTID SYSTEM: --Common carotid artery: Widely patent origin without common carotid artery dissection or aneurysm. --Internal carotid artery: No dissection, occlusion or aneurysm. There is calcific atherosclerosis extending into the proximal ICA, resulting in less than 50% stenosis. Retropharyngeal course. --External carotid artery: No acute abnormality. LEFT CAROTID SYSTEM: --Common carotid artery: Widely patent origin without common carotid artery dissection or aneurysm. --Internal carotid artery: No dissection, occlusion or aneurysm. There is calcific atherosclerosis extending into the proximal ICA, resulting in 50% stenosis. Retropharyngeal course. --External carotid artery: No acute abnormality. VERTEBRAL ARTERIES: Right dominant configuration. Limited visualization of the origins. No dissection, occlusion or flow-limiting stenosis to the vertebrobasilar confluence. CTA HEAD FINDINGS ANTERIOR CIRCULATION: --Intracranial internal carotid arteries: Atherosclerotic calcification of the internal carotid arteries at the skull base without hemodynamically significant stenosis. --Anterior cerebral arteries: Normal. Both A1 segments are present. Patent anterior communicating artery. --Middle cerebral arteries: Normal. --Posterior communicating arteries: Absent bilaterally. POSTERIOR CIRCULATION: --Basilar artery: Normal. --Posterior cerebral arteries: Normal. --Superior cerebellar arteries: Normal. --Inferior cerebellar arteries: Normal anterior and posterior inferior cerebellar arteries. VENOUS SINUSES: As permitted by contrast timing, patent. ANATOMIC VARIANTS: None DELAYED PHASE:  No parenchymal contrast enhancement. Review of the MIP images confirms the above findings. IMPRESSION: 1. No emergent large vessel occlusion or other acute abnormality. 2. 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 3.  Aortic atherosclerosis  (ICD10-I70.0). Electronically Signed   By: Ulyses Jarred M.D.   On: 02/04/2018 01:19   Dg Hand 2 View Left  Result Date: 02/04/2018 CLINICAL DATA:  Hand pain and spasms EXAM: LEFT HAND - 2 VIEW COMPARISON:  Left wrist 01/04/2012 FINDINGS: Diffuse interphalangeal osteoarthritis with mild joint space narrowing and spurring diffusely. No erosion. Mild degenerative change in the radiocarpal joint. Negative for fracture. IMPRESSION: Osteoarthritis. Electronically Signed   By: Franchot Gallo M.D.   On: 02/04/2018 13:06    Lab Data:  CBC: Recent Labs  Lab 02/03/18 1032 02/04/18 0347  WBC 7.0 7.6  HGB 12.3 11.6*  HCT 40.8 36.4  MCV 84.0 82.4  PLT 233 841   Basic Metabolic Panel: Recent Labs  Lab 02/03/18 1032 02/04/18 0347 02/05/18 0323 02/06/18 0405  NA 139 138 137 134*  K 3.8 3.7 3.5 4.0  CL 103 102 100 100  CO2 27 31 27 26   GLUCOSE 149* 241* 171* 126*  BUN 17 18 20  30*  CREATININE 1.21* 1.31* 1.43* 1.81*  CALCIUM 9.2 8.8* 8.4* 8.3*  MG  --  1.7  --   --    GFR: Estimated Creatinine Clearance: 38.3 mL/min (A) (by C-G formula based on SCr of 1.81 mg/dL (H)). Liver Function Tests: Recent Labs  Lab 02/03/18 1515  AST 14*  ALT 14  ALKPHOS 65  BILITOT 0.5  PROT 7.3  ALBUMIN 3.2*   No results for input(s): LIPASE, AMYLASE in the last 168 hours. No results for input(s): AMMONIA in the last 168 hours. Coagulation Profile: No results for input(s): INR, PROTIME in the last 168 hours. Cardiac Enzymes: Recent Labs  Lab 02/03/18 1515  TROPONINI <0.03   BNP (last 3 results) No results for input(s): PROBNP in the last 8760 hours. HbA1C: Recent Labs    02/03/18 2314  HGBA1C 8.3*   CBG: Recent Labs  Lab 02/05/18 1206 02/05/18 1631 02/05/18 2118 02/06/18 0656 02/06/18 1149  GLUCAP 160* 197* 172* 116* 146*   Lipid Profile: Recent Labs    02/04/18 0347  CHOL 177  HDL 52  LDLCALC 107*  TRIG 92  CHOLHDL 3.4   Thyroid Function Tests: No results for  input(s): TSH, T4TOTAL, FREET4, T3FREE, THYROIDAB in the last 72 hours. Anemia Panel: No results for input(s): VITAMINB12, FOLATE, FERRITIN, TIBC, IRON, RETICCTPCT in the last 72 hours. Urine analysis:    Component Value Date/Time   COLORURINE YELLOW 02/03/2018 1155   APPEARANCEUR CLEAR 02/03/2018 1155   LABSPEC 1.015 02/03/2018 1155   PHURINE 5.0 02/03/2018 1155   GLUCOSEU NEGATIVE 02/03/2018 1155   HGBUR SMALL (A) 02/03/2018 1155   BILIRUBINUR NEGATIVE 02/03/2018 1155   KETONESUR NEGATIVE 02/03/2018 1155   PROTEINUR 100 (A) 02/03/2018 1155   UROBILINOGEN 1.0 07/31/2013 1359   NITRITE NEGATIVE 02/03/2018 1155   LEUKOCYTESUR NEGATIVE 02/03/2018 1155     Waldron Gerry M.D. [facility] 02/06/2018, 12:17 PM  Pager: [phone] Between 7am to 7pm - call Pager - [phone]  After 7pm go to [email or web address] - password [email or web address]  Call night coverage person covering after 7pm

## 2018-02-07 DIAGNOSIS — J810 Acute pulmonary edema: Secondary | ICD-10-CM | POA: Diagnosis not present

## 2018-02-07 DIAGNOSIS — M7989 Other specified soft tissue disorders: Secondary | ICD-10-CM | POA: Diagnosis not present

## 2018-02-07 DIAGNOSIS — G459 Transient cerebral ischemic attack, unspecified: Secondary | ICD-10-CM | POA: Diagnosis not present

## 2018-02-07 DIAGNOSIS — E1159 Type 2 diabetes mellitus with other circulatory complications: Secondary | ICD-10-CM | POA: Diagnosis not present

## 2018-02-07 LAB — GLUCOSE, CAPILLARY
GLUCOSE-CAPILLARY: 97 mg/dL (ref 70–99)
GLUCOSE-CAPILLARY: 120 mg/dL — AB (ref 70–99)
Glucose-Capillary: 163 mg/dL — ABNORMAL HIGH (ref 70–99)
Glucose-Capillary: 141 mg/dL — ABNORMAL HIGH (ref 70–99)

## 2018-02-07 LAB — BASIC METABOLIC PANEL
ANION GAP: 6 (ref 5–15)
BUN: 27 mg/dL — ABNORMAL HIGH (ref 8–23)
CHLORIDE: 104 mmol/L (ref 98–111)
CO2: 28 mmol/L (ref 22–32)
Calcium: 7.9 mg/dL — ABNORMAL LOW (ref 8.9–10.3)
Creatinine, Ser: 1.51 mg/dL — ABNORMAL HIGH (ref 0.44–1.00)
GFR calc non Af Amer: 33 mL/min — ABNORMAL LOW (ref >60)
GFR, EST AFRICAN AMERICAN: 38 mL/min — AB (ref >60)
GLUCOSE: 110 mg/dL — AB (ref 70–99)
Potassium: 4 mmol/L (ref 3.5–5.1)
Sodium: 138 mmol/L (ref 135–145)

## 2018-02-07 MED ORDER — ASPIRIN 81 MG PO TBEC: 81.0000 mg | DELAYED_RELEASE_TABLET | Freq: Every day | ORAL | Status: AC

## 2018-02-07 MED ORDER — HYDRALAZINE HCL 25 MG PO TABS: 25.0000 mg | ORAL_TABLET | Freq: Two times a day (BID) | ORAL | Status: AC

## 2018-02-07 MED ORDER — HYDRALAZINE HCL 25 MG PO TABS
25.0000 mg | ORAL_TABLET | Freq: Two times a day (BID) | ORAL | Status: DC
  Administered 2018-02-07 – 2018-02-10 (×7): 25 mg via ORAL
  Filled 2018-02-07 (×7): qty 1

## 2018-02-07 MED ORDER — ATORVASTATIN CALCIUM 40 MG PO TABS: 40.0000 mg | ORAL_TABLET | Freq: Every day | ORAL | Status: AC

## 2018-02-07 MED ORDER — MECLIZINE HCL 25 MG PO TABS: 25.0000 mg | ORAL_TABLET | Freq: Three times a day (TID) | ORAL | 0 refills | Status: AC | PRN

## 2018-02-07 MED ORDER — SENNOSIDES-DOCUSATE SODIUM 8.6-50 MG PO TABS: 1.0000 | ORAL_TABLET | Freq: Every evening | ORAL | Status: AC | PRN

## 2018-02-07 MED ORDER — INSULIN ASPART 100 UNIT/ML ~~LOC~~ SOLN: 0.0000 [IU] | Freq: Three times a day (TID) | SUBCUTANEOUS | 11 refills | Status: AC

## 2018-02-07 MED ORDER — INSULIN GLARGINE 100 UNIT/ML ~~LOC~~ SOLN: 24.0000 [IU] | Freq: Every day | SUBCUTANEOUS | 11 refills | Status: AC

## 2018-02-07 MED ORDER — GABAPENTIN 100 MG PO CAPS: 200.0000 mg | ORAL_CAPSULE | Freq: Two times a day (BID) | ORAL | Status: AC

## 2018-02-07 NOTE — Progress Notes (Signed)
[facility]                                                                              Patient Demographics  Bianca Barnett, is a 77 y.o. female, DOB - Oct 14, 1940, ZOX:[id number]  Admit date - 02/03/2018   Admitting Physician Lenore Cordia, MD  Outpatient Primary MD for the patient is No primary care provider on file.  Outpatient specialists:   LOS - 0  days   Medical records reviewed and are as summarized below:    Chief Complaint  Patient presents with  . Weakness       Brief summary   Patient is a 77 year old female with type 2 diabetes, hypertension, hyperlipidemia, morbid obesity presented with lower extremity swelling over the last 2 weeks, no shortness of breath but wakes up in the middle of the night with palpitations.  No chest pain, orthopnea or PND.  She reported drinking a lot of fluids.  She experienced an episode of left-sided facial droop day prior to admission with some slurred speech, symptoms lasted 1 to 1.5 hours and resolved.  Patient returned back to her baseline.  Patient was admitted for further work-up.   Assessment & Plan    Principal Problem:   TIA (transient ischemic attack) -Suspected to have TIA with transient left facial droop and slurred speech however was not able to undergo MRI brain due to body habitus.  Currently left facial droop and slurred speech resolved. -Neurology was consulted in the ED who recommended CT angiogram of the head and neck which showed no emergent large vessel occlusion or other acute abnormality, 50% stenosis of the proximal left ICA secondary to calcific atherosclerosis. -Continue aspirin, statin -LDL 107, continue statin Hemoglobin A1c 8.3 2D echo showed EF of 55 to 60% with grade 1 diastolic dysfunction. PT evaluation recommended SNF, awaiting SNF bed, awaiting insurance authorization  Active Problems: Peripheral edema in the last 2 weeks -Improved, IV Lasix was discontinued as creatinine started  to trend up.   2D echo showed EF of 55 to 60% with grade 1 diastolic dysfunction  Mild acute kidney injury Likely due to IV diuresis, creatinine 1.8 on 11/8, Lasix was discontinued on 11/7. Patient was placed on gentle hydration, continue to hold Avapro, creatinine improving, 1.5 today DC IV fluids    DM (diabetes mellitus) (HCC) -Hemoglobin A1c 8.3,  -CBGs fairly stable, continue Lantus, meal coverage, sliding scale     Hypertension associated with diabetes ([place]) -BP somewhat elevated today, placed on hydralazine 25 mg twice a day, continue to hold Avapro, Lasix   carpal tunnel syndrome, left hand -Feeling better today with the splint -Continue Neurontin to 200 mg twice a day -Left hand x-ray showed osteoarthritis  Right knee pain X-ray showed severe osteoarthritis  Acromioclavicular arthropathy Continue pain control, PT  Morbid obesity BMI 67.1, counseled on diet and weight control   Code Status: DNR DVT Prophylaxis: Lovenox Family Communication: Discussed in detail with the patient, all imaging results, lab results explained to the patient and granddaughter   Disposition Plan: Awaiting insurance authorization for skilled nursing facility  Time Spent in minutes   25 minutes  Procedures:  CT angiogram head and neck 2D echo  Consultants:   None  Antimicrobials:      Medications  Scheduled Meds: . aspirin EC  81 mg Oral Daily  . atorvastatin  40 mg Oral q1800  . enoxaparin (LOVENOX) injection  75 mg Subcutaneous QHS  . gabapentin  200 mg Oral BID  . hydrALAZINE  25 mg Oral BID  . hydrOXYzine  50 mg Oral QHS  . Influenza vac split quadrivalent PF  0.5 mL Intramuscular Tomorrow-1000  . insulin aspart  0-15 Units Subcutaneous TID WC  . insulin aspart  4 Units Subcutaneous TID WC  . insulin glargine  24 Units Subcutaneous QHS  . pneumococcal 23 valent vaccine  0.5 mL Intramuscular Tomorrow-1000  . sodium chloride flush  3 mL Intravenous Q12H   Continuous  Infusions: . sodium chloride Stopped (02/07/18 0551)   PRN Meds:.acetaminophen **OR** acetaminophen, albuterol, meclizine, senna-docusate   Antibiotics   Anti-infectives (From admission, onward)   None        Subjective:   Bianca Barnett was seen and examined today.  No complaints, did not sleep too well last night.  Otherwise no acute issues.  No chest pain shortness breath, fevers or chills.    Objective:   Vitals:   02/06/18 2004 02/06/18 2300 02/07/18 0329 02/07/18 0818  BP: (!) 150/82 (!) 170/92 (!) 141/67 (!) 147/73  Pulse: 76 75 69 78  Resp: 20 18 17 20   Temp: 98 F (36.7 C) 98 F (36.7 C) 97.9 F (36.6 C) 97.7 F (36.5 C)  TempSrc: Oral Oral Oral Oral  SpO2: 99% 95% 98% 100%  Weight:      Height:        Intake/Output Summary (Last 24 hours) at 02/07/2018 1114 Last data filed at 02/07/2018 1050 Gross per 24 hour  Intake 2002.21 ml  Output 1175 ml  Net 827.21 ml     Wt Readings from Last 3 Encounters:  02/06/18 (!) 157.4 kg  08/25/16 (!) 157.4 kg  08/09/15 (!) 155.1 kg     Exam  General: Alert and oriented x 3, NAD Eyes:  HEENT:  Atraumatic, normocephalic Cardiovascular: S1 S2 auscultated, RRR no pedal edema b/l Respiratory: Clear to auscultation bilaterally, no wheezing, rales or rhonchi Gastrointestinal: Obese, soft, nontender, nondistended, + bowel sounds Ext: no pedal edema bilaterally Neuro: no deficit Musculoskeletal: No digital cyanosis, clubbing Skin: No rashes Psych: Normal affect and demeanor, alert and oriented x3     Data Reviewed:  I have personally reviewed following labs and imaging studies  Micro Results No results found for this or any previous visit (from the past 240 hour(s)).  Radiology Reports Ct Angio Head W/cm &/or Wo Cm  Result Date: 02/04/2018 CLINICAL DATA:  Facial weakness EXAM: CT ANGIOGRAPHY HEAD AND NECK TECHNIQUE: Multidetector CT imaging of the head and neck was performed using the standard protocol during  bolus administration of intravenous contrast. Multiplanar CT image reconstructions and MIPs were obtained to evaluate the vascular anatomy. Carotid stenosis measurements (when applicable) are obtained utilizing NASCET criteria, using the distal internal carotid diameter as the denominator. CONTRAST:  71mL ISOVUE-370 IOPAMIDOL (ISOVUE-370) INJECTION 76% COMPARISON:  Head CT 02/03/2018 FINDINGS: CT HEAD FINDINGS Brain: There is no mass, hemorrhage or extra-axial collection. The size and configuration of the ventricles and extra-axial CSF spaces are normal. There is no acute or chronic infarction. There is hypoattenuation of the periventricular white matter, most commonly indicating chronic ischemic microangiopathy. Skull: The visualized skull base, calvarium and extracranial  soft tissues are normal. Sinuses/Orbits: No fluid levels or advanced mucosal thickening of the visualized paranasal sinuses. No mastoid or middle ear effusion. The orbits are normal. CTA NECK FINDINGS SKELETON: There is no bony spinal canal stenosis. No lytic or blastic lesion. OTHER NECK: Normal pharynx, larynx and major salivary glands. No cervical lymphadenopathy. Unremarkable thyroid gland. UPPER CHEST: No pneumothorax or pleural effusion. No nodules or masses. AORTIC ARCH: There is mild calcific atherosclerosis of the aortic arch. There is no aneurysm, dissection or hemodynamically significant stenosis of the visualized ascending aorta and aortic arch. Conventional 3 vessel aortic branching pattern. The visualized proximal subclavian arteries are widely patent. RIGHT CAROTID SYSTEM: --Common carotid artery: Widely patent origin without common carotid artery dissection or aneurysm. --Internal carotid artery: No dissection, occlusion or aneurysm. There is calcific atherosclerosis extending into the proximal ICA, resulting in less than 50% stenosis. Retropharyngeal course. --External carotid artery: No acute abnormality. LEFT CAROTID SYSTEM:  --Common carotid artery: Widely patent origin without common carotid artery dissection or aneurysm. --Internal carotid artery: No dissection, occlusion or aneurysm. There is calcific atherosclerosis extending into the proximal ICA, resulting in 50% stenosis. Retropharyngeal course. --External carotid artery: No acute abnormality. VERTEBRAL ARTERIES: Right dominant configuration. Limited visualization of the origins. No dissection, occlusion or flow-limiting stenosis to the vertebrobasilar confluence. CTA HEAD FINDINGS ANTERIOR CIRCULATION: --Intracranial internal carotid arteries: Atherosclerotic calcification of the internal carotid arteries at the skull base without hemodynamically significant stenosis. --Anterior cerebral arteries: Normal. Both A1 segments are present. Patent anterior communicating artery. --Middle cerebral arteries: Normal. --Posterior communicating arteries: Absent bilaterally. POSTERIOR CIRCULATION: --Basilar artery: Normal. --Posterior cerebral arteries: Normal. --Superior cerebellar arteries: Normal. --Inferior cerebellar arteries: Normal anterior and posterior inferior cerebellar arteries. VENOUS SINUSES: As permitted by contrast timing, patent. ANATOMIC VARIANTS: None DELAYED PHASE: No parenchymal contrast enhancement. Review of the MIP images confirms the above findings. IMPRESSION: 1. No emergent large vessel occlusion or other acute abnormality. 2. 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 3.  Aortic atherosclerosis (ICD10-I70.0). Electronically Signed   By: Ulyses Jarred M.D.   On: 02/04/2018 01:19   Dg Chest 2 View  Result Date: 02/03/2018 CLINICAL DATA:  Diffuse body swelling and tachycardia. Wheezing for 2 months. EXAM: CHEST - 2 VIEW COMPARISON:  07/26/2015 chest radiograph FINDINGS: Cardiomegaly and pulmonary vascular congestion noted. There is no evidence of focal airspace disease, pulmonary edema, suspicious pulmonary  nodule/mass, pleural effusion, or pneumothorax. No acute bony abnormalities are identified. IMPRESSION: Cardiomegaly with pulmonary vascular congestion. Electronically Signed   By: Margarette Canada M.D.   On: 02/03/2018 16:07   Dg Knee 1-2 Views Right  Result Date: 02/05/2018 CLINICAL DATA:  Swelling of the right knee for 1 month with pain, no acute abnormality. EXAM: RIGHT KNEE - 1-2 VIEW COMPARISON:  Right knee films of 01/04/2012 FINDINGS: There is little change in tricompartmental degenerative joint disease the right knee primarily involving the medial compartment where there is more loss of joint space and sclerosis present. No acute fracture is seen. There is a small amount of right knee joint fluid present. The patella appears normally positioned. IMPRESSION: 1. Tricompartmental degenerative joint disease the right knee primarily involving the medial compartment. 2. No acute fracture. 3. Small amount of fluid in the right knee joint space. Electronically Signed   By: Ivar Drape M.D.   On: 02/05/2018 11:47   Ct Head Wo Contrast  Result Date: 02/03/2018 CLINICAL DATA:  Weakness over the last week. Facial droop beginning  yesterday. EXAM: CT HEAD WITHOUT CONTRAST TECHNIQUE: Contiguous axial images were obtained from the base of the skull through the vertex without intravenous contrast. COMPARISON:  Brain MRI some 07/2012 FINDINGS: Brain: There is some area of cortical and subcortical low density in the insular region on the left that appears more prominent than was seen on previous studies. There could be recent infarction in that area. The remainder the brain shows atrophy and chronic small-vessel ischemic change of the white matter. No mass lesion, hemorrhage, hydrocephalus or extra-axial collection. Vascular: There is atherosclerotic calcification of the major vessels at the base of the brain. Skull: Negative Sinuses/Orbits: Clear/normal Other: None IMPRESSION: There is some low-density in the insular  cortical and subcortical brain on the left that appears more prominent than on previous studies, raising the possibility of extension of an infarction in the left insular region. Evidence of hemorrhage or mass effect. Atrophy and chronic small-vessel ischemic changes elsewhere. Electronically Signed   By: Nelson Chimes M.D.   On: 02/03/2018 15:47   Ct Angio Neck W Or Wo Contrast  Result Date: 02/04/2018 CLINICAL DATA:  Facial weakness EXAM: CT ANGIOGRAPHY HEAD AND NECK TECHNIQUE: Multidetector CT imaging of the head and neck was performed using the standard protocol during bolus administration of intravenous contrast. Multiplanar CT image reconstructions and MIPs were obtained to evaluate the vascular anatomy. Carotid stenosis measurements (when applicable) are obtained utilizing NASCET criteria, using the distal internal carotid diameter as the denominator. CONTRAST:  77mL ISOVUE-370 IOPAMIDOL (ISOVUE-370) INJECTION 76% COMPARISON:  Head CT 02/03/2018 FINDINGS: CT HEAD FINDINGS Brain: There is no mass, hemorrhage or extra-axial collection. The size and configuration of the ventricles and extra-axial CSF spaces are normal. There is no acute or chronic infarction. There is hypoattenuation of the periventricular white matter, most commonly indicating chronic ischemic microangiopathy. Skull: The visualized skull base, calvarium and extracranial soft tissues are normal. Sinuses/Orbits: No fluid levels or advanced mucosal thickening of the visualized paranasal sinuses. No mastoid or middle ear effusion. The orbits are normal. CTA NECK FINDINGS SKELETON: There is no bony spinal canal stenosis. No lytic or blastic lesion. OTHER NECK: Normal pharynx, larynx and major salivary glands. No cervical lymphadenopathy. Unremarkable thyroid gland. UPPER CHEST: No pneumothorax or pleural effusion. No nodules or masses. AORTIC ARCH: There is mild calcific atherosclerosis of the aortic arch. There is no aneurysm, dissection or  hemodynamically significant stenosis of the visualized ascending aorta and aortic arch. Conventional 3 vessel aortic branching pattern. The visualized proximal subclavian arteries are widely patent. RIGHT CAROTID SYSTEM: --Common carotid artery: Widely patent origin without common carotid artery dissection or aneurysm. --Internal carotid artery: No dissection, occlusion or aneurysm. There is calcific atherosclerosis extending into the proximal ICA, resulting in less than 50% stenosis. Retropharyngeal course. --External carotid artery: No acute abnormality. LEFT CAROTID SYSTEM: --Common carotid artery: Widely patent origin without common carotid artery dissection or aneurysm. --Internal carotid artery: No dissection, occlusion or aneurysm. There is calcific atherosclerosis extending into the proximal ICA, resulting in 50% stenosis. Retropharyngeal course. --External carotid artery: No acute abnormality. VERTEBRAL ARTERIES: Right dominant configuration. Limited visualization of the origins. No dissection, occlusion or flow-limiting stenosis to the vertebrobasilar confluence. CTA HEAD FINDINGS ANTERIOR CIRCULATION: --Intracranial internal carotid arteries: Atherosclerotic calcification of the internal carotid arteries at the skull base without hemodynamically significant stenosis. --Anterior cerebral arteries: Normal. Both A1 segments are present. Patent anterior communicating artery. --Middle cerebral arteries: Normal. --Posterior communicating arteries: Absent bilaterally. POSTERIOR CIRCULATION: --Basilar artery: Normal. --Posterior cerebral arteries: Normal. --Superior  cerebellar arteries: Normal. --Inferior cerebellar arteries: Normal anterior and posterior inferior cerebellar arteries. VENOUS SINUSES: As permitted by contrast timing, patent. ANATOMIC VARIANTS: None DELAYED PHASE: No parenchymal contrast enhancement. Review of the MIP images confirms the above findings. IMPRESSION: 1. No emergent large vessel  occlusion or other acute abnormality. 2. 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 3.  Aortic atherosclerosis (ICD10-I70.0). Electronically Signed   By: Ulyses Jarred M.D.   On: 02/04/2018 01:19   Dg Hand 2 View Left  Result Date: 02/04/2018 CLINICAL DATA:  Hand pain and spasms EXAM: LEFT HAND - 2 VIEW COMPARISON:  Left wrist 01/04/2012 FINDINGS: Diffuse interphalangeal osteoarthritis with mild joint space narrowing and spurring diffusely. No erosion. Mild degenerative change in the radiocarpal joint. Negative for fracture. IMPRESSION: Osteoarthritis. Electronically Signed   By: Franchot Gallo M.D.   On: 02/04/2018 13:06    Lab Data:  CBC: Recent Labs  Lab 02/03/18 1032 02/04/18 0347  WBC 7.0 7.6  HGB 12.3 11.6*  HCT 40.8 36.4  MCV 84.0 82.4  PLT 233 277   Basic Metabolic Panel: Recent Labs  Lab 02/04/18 0347 02/05/18 0323 02/06/18 0405 02/06/18 1233 02/07/18 0546  NA 138 137 134* 134* 138  K 3.7 3.5 4.0 4.2 4.0  CL 102 100 100 98 104  CO2 31 27 26 29 28   GLUCOSE 241* 171* 126* 167* 110*  BUN 18 20 30* 28* 27*  CREATININE 1.31* 1.43* 1.81* 1.81* 1.51*  CALCIUM 8.8* 8.4* 8.3* 8.4* 7.9*  MG 1.7  --   --   --   --    GFR: Estimated Creatinine Clearance: 45.9 mL/min (A) (by C-G formula based on SCr of 1.51 mg/dL (H)). Liver Function Tests: Recent Labs  Lab 02/03/18 1515  AST 14*  ALT 14  ALKPHOS 65  BILITOT 0.5  PROT 7.3  ALBUMIN 3.2*   No results for input(s): LIPASE, AMYLASE in the last 168 hours. No results for input(s): AMMONIA in the last 168 hours. Coagulation Profile: No results for input(s): INR, PROTIME in the last 168 hours. Cardiac Enzymes: Recent Labs  Lab 02/03/18 1515  TROPONINI <0.03   BNP (last 3 results) No results for input(s): PROBNP in the last 8760 hours. HbA1C: No results for input(s): HGBA1C in the last 72 hours. CBG: Recent Labs  Lab 02/06/18 1149 02/06/18 1607  02/06/18 2136 02/07/18 0824 02/07/18 1105  GLUCAP 146* 140* 151* 97 163*   Lipid Profile: No results for input(s): CHOL, HDL, LDLCALC, TRIG, CHOLHDL, LDLDIRECT in the last 72 hours. Thyroid Function Tests: No results for input(s): TSH, T4TOTAL, FREET4, T3FREE, THYROIDAB in the last 72 hours. Anemia Panel: No results for input(s): VITAMINB12, FOLATE, FERRITIN, TIBC, IRON, RETICCTPCT in the last 72 hours. Urine analysis:    Component Value Date/Time   COLORURINE YELLOW 02/03/2018 1155   APPEARANCEUR CLEAR 02/03/2018 1155   LABSPEC 1.015 02/03/2018 1155   PHURINE 5.0 02/03/2018 1155   GLUCOSEU NEGATIVE 02/03/2018 1155   HGBUR SMALL (A) 02/03/2018 1155   BILIRUBINUR NEGATIVE 02/03/2018 1155   KETONESUR NEGATIVE 02/03/2018 1155   PROTEINUR 100 (A) 02/03/2018 1155   UROBILINOGEN 1.0 07/31/2013 1359   NITRITE NEGATIVE 02/03/2018 1155   LEUKOCYTESUR NEGATIVE 02/03/2018 1155     Idamay Hosein M.D. [facility] 02/07/2018, 11:14 AM  Pager: [phone] Between 7am to 7pm - call Pager - [phone]  After 7pm go to [email or web address] - password [email or web address]  Call night coverage person covering after 7pm

## 2018-02-08 DIAGNOSIS — E0801 Diabetes mellitus due to underlying condition with hyperosmolarity with coma: Secondary | ICD-10-CM | POA: Diagnosis not present

## 2018-02-08 DIAGNOSIS — J810 Acute pulmonary edema: Secondary | ICD-10-CM | POA: Diagnosis not present

## 2018-02-08 DIAGNOSIS — G459 Transient cerebral ischemic attack, unspecified: Secondary | ICD-10-CM | POA: Diagnosis not present

## 2018-02-08 LAB — GLUCOSE, CAPILLARY
GLUCOSE-CAPILLARY: 114 mg/dL — AB (ref 70–99)
GLUCOSE-CAPILLARY: 137 mg/dL — AB (ref 70–99)
GLUCOSE-CAPILLARY: 113 mg/dL — AB (ref 70–99)
Glucose-Capillary: 102 mg/dL — ABNORMAL HIGH (ref 70–99)
Glucose-Capillary: 159 mg/dL — ABNORMAL HIGH (ref 70–99)

## 2018-02-08 NOTE — Progress Notes (Signed)
[facility]                                                                              Patient Demographics  Bianca Barnett, is a 77 y.o. female, DOB - 12/27/1940, URK:[id number]  Admit date - 02/03/2018   Admitting Physician Lenore Cordia, MD  Outpatient Primary MD for the patient is No primary care provider on file.  Outpatient specialists:   LOS - 0  days   Medical records reviewed and are as summarized below:    Chief Complaint  Patient presents with  . Weakness       Brief summary   Patient is a 77 year old female with type 2 diabetes, hypertension, hyperlipidemia, morbid obesity presented with lower extremity swelling over the last 2 weeks, no shortness of breath but wakes up in the middle of the night with palpitations.  No chest pain, orthopnea or PND.  She reported drinking a lot of fluids.  She experienced an episode of left-sided facial droop day prior to admission with some slurred speech, symptoms lasted 1 to 1.5 hours and resolved.  Patient returned back to her baseline.  Patient was admitted for further work-up.   Assessment & Plan    Principal Problem:   TIA (transient ischemic attack) -Suspected to have TIA with transient left facial droop and slurred speech however was not able to undergo MRI brain due to body habitus.  Currently left facial droop and slurred speech resolved. -Neurology was consulted in the ED who recommended CT angiogram of the head and neck which showed no emergent large vessel occlusion or other acute abnormality, 50% stenosis of the proximal left ICA secondary to calcific atherosclerosis. -Continue aspirin, statin -LDL 107, continue statin Hemoglobin A1c 8.3 2D echo showed EF of 55 to 60% with grade 1 diastolic dysfunction. PT evaluation recommended SNF, awaiting SNF bed, awaiting insurance authorization No acute issues today  Active Problems: Peripheral edema in the last 2 weeks -Improved, IV Lasix was discontinued  as creatinine started to trend up.   2D echo showed EF of 55 to 60% with grade 1 diastolic dysfunction  Mild acute kidney injury Likely due to IV diuresis, creatinine 1.8 on 11/8, Lasix was discontinued on 11/7. Patient was placed on gentle hydration, continue to hold Avapro IV fluids have been discontinued, creatinine has been trending down, 1.5 on 11/9    DM (diabetes mellitus) ([place]) -Hemoglobin A1c 8.3,  -CBGs stable continue Lantus, meal coverage, sliding scale     Hypertension associated with diabetes ([place]) -BP improving, continue hydralazine   continue to hold Avapro, Lasix   carpal tunnel syndrome, left hand -Continue splint -Continue Neurontin to 200 mg twice a day -Left hand x-ray showed osteoarthritis  Right knee pain X-ray showed severe osteoarthritis  Acromioclavicular arthropathy Continue pain control, PT  Morbid obesity BMI 67.1, counseled on diet and weight control   Code Status: DNR DVT Prophylaxis: Lovenox Family Communication: Discussed in detail with the patient, all imaging results, lab results explained to the patient and granddaughter at the bedside   Disposition Plan: Awaiting insurance authorization for skilled nursing facility, possible DC in a.m.  Time Spent  in minutes   25 minutes  Procedures:  CT angiogram head and neck 2D echo  Consultants:   None  Antimicrobials:      Medications  Scheduled Meds: . aspirin EC  81 mg Oral Daily  . atorvastatin  40 mg Oral q1800  . enoxaparin (LOVENOX) injection  75 mg Subcutaneous QHS  . gabapentin  200 mg Oral BID  . hydrALAZINE  25 mg Oral BID  . hydrOXYzine  50 mg Oral QHS  . Influenza vac split quadrivalent PF  0.5 mL Intramuscular Tomorrow-1000  . insulin aspart  0-15 Units Subcutaneous TID WC  . insulin aspart  4 Units Subcutaneous TID WC  . insulin glargine  24 Units Subcutaneous QHS  . pneumococcal 23 valent vaccine  0.5 mL Intramuscular Tomorrow-1000  . sodium chloride flush  3 mL  Intravenous Q12H   Continuous Infusions: . sodium chloride Stopped (02/07/18 0551)   PRN Meds:.acetaminophen **OR** acetaminophen, albuterol, meclizine, senna-docusate   Antibiotics   Anti-infectives (From admission, onward)   None        Subjective:   Katheline Scism was seen and examined today.  No complaints, awaiting skilled nursing facility bed.  No acute issues.  No chest pain shortness breath, fevers or chills.    Objective:   Vitals:   02/07/18 2338 02/08/18 0432 02/08/18 0834 02/08/18 1212  BP: 126/62 (!) 150/79 (!) 144/76 133/82  Pulse: 70 66 68 75  Resp: 20 18 20 16   Temp: 98.4 F (36.9 C) (!) 97.3 F (36.3 C) (!) 97.3 F (36.3 C) 98.6 F (37 C)  TempSrc: Oral Oral Oral Oral  SpO2: 100% 99% 99% 100%  Weight:      Height:        Intake/Output Summary (Last 24 hours) at 02/08/2018 1230 Last data filed at 02/08/2018 0851 Gross per 24 hour  Intake 720 ml  Output 100 ml  Net 620 ml     Wt Readings from Last 3 Encounters:  02/06/18 (!) 157.4 kg  08/25/16 (!) 157.4 kg  08/09/15 (!) 155.1 kg     Exam   General: Alert and oriented x 3, NAD Eyes:  HEENT:   Cardiovascular: S1 S2 clear. RRR No pedal edema b/l Respiratory: CTAB Gastrointestinal: Soft, NT, NBS Ext: no pedal edema bilaterally Neuro: no new deficits Musculoskeletal: No digital cyanosis, clubbing Skin: No rashes Psych: Normal affect and demeanor, alert and oriented x3     Data Reviewed:  I have personally reviewed following labs and imaging studies  Micro Results No results found for this or any previous visit (from the past 240 hour(s)).  Radiology Reports Ct Angio Head W/cm &/or Wo Cm  Result Date: 02/04/2018 CLINICAL DATA:  Facial weakness EXAM: CT ANGIOGRAPHY HEAD AND NECK TECHNIQUE: Multidetector CT imaging of the head and neck was performed using the standard protocol during bolus administration of intravenous contrast. Multiplanar CT image reconstructions and MIPs were  obtained to evaluate the vascular anatomy. Carotid stenosis measurements (when applicable) are obtained utilizing NASCET criteria, using the distal internal carotid diameter as the denominator. CONTRAST:  57mL ISOVUE-370 IOPAMIDOL (ISOVUE-370) INJECTION 76% COMPARISON:  Head CT 02/03/2018 FINDINGS: CT HEAD FINDINGS Brain: There is no mass, hemorrhage or extra-axial collection. The size and configuration of the ventricles and extra-axial CSF spaces are normal. There is no acute or chronic infarction. There is hypoattenuation of the periventricular white matter, most commonly indicating chronic ischemic microangiopathy. Skull: The visualized skull base, calvarium and extracranial soft tissues are normal. Sinuses/Orbits: No fluid  levels or advanced mucosal thickening of the visualized paranasal sinuses. No mastoid or middle ear effusion. The orbits are normal. CTA NECK FINDINGS SKELETON: There is no bony spinal canal stenosis. No lytic or blastic lesion. OTHER NECK: Normal pharynx, larynx and major salivary glands. No cervical lymphadenopathy. Unremarkable thyroid gland. UPPER CHEST: No pneumothorax or pleural effusion. No nodules or masses. AORTIC ARCH: There is mild calcific atherosclerosis of the aortic arch. There is no aneurysm, dissection or hemodynamically significant stenosis of the visualized ascending aorta and aortic arch. Conventional 3 vessel aortic branching pattern. The visualized proximal subclavian arteries are widely patent. RIGHT CAROTID SYSTEM: --Common carotid artery: Widely patent origin without common carotid artery dissection or aneurysm. --Internal carotid artery: No dissection, occlusion or aneurysm. There is calcific atherosclerosis extending into the proximal ICA, resulting in less than 50% stenosis. Retropharyngeal course. --External carotid artery: No acute abnormality. LEFT CAROTID SYSTEM: --Common carotid artery: Widely patent origin without common carotid artery dissection or aneurysm.  --Internal carotid artery: No dissection, occlusion or aneurysm. There is calcific atherosclerosis extending into the proximal ICA, resulting in 50% stenosis. Retropharyngeal course. --External carotid artery: No acute abnormality. VERTEBRAL ARTERIES: Right dominant configuration. Limited visualization of the origins. No dissection, occlusion or flow-limiting stenosis to the vertebrobasilar confluence. CTA HEAD FINDINGS ANTERIOR CIRCULATION: --Intracranial internal carotid arteries: Atherosclerotic calcification of the internal carotid arteries at the skull base without hemodynamically significant stenosis. --Anterior cerebral arteries: Normal. Both A1 segments are present. Patent anterior communicating artery. --Middle cerebral arteries: Normal. --Posterior communicating arteries: Absent bilaterally. POSTERIOR CIRCULATION: --Basilar artery: Normal. --Posterior cerebral arteries: Normal. --Superior cerebellar arteries: Normal. --Inferior cerebellar arteries: Normal anterior and posterior inferior cerebellar arteries. VENOUS SINUSES: As permitted by contrast timing, patent. ANATOMIC VARIANTS: None DELAYED PHASE: No parenchymal contrast enhancement. Review of the MIP images confirms the above findings. IMPRESSION: 1. No emergent large vessel occlusion or other acute abnormality. 2. 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 3.  Aortic atherosclerosis (ICD10-I70.0). Electronically Signed   By: Ulyses Jarred M.D.   On: 02/04/2018 01:19   Dg Chest 2 View  Result Date: 02/03/2018 CLINICAL DATA:  Diffuse body swelling and tachycardia. Wheezing for 2 months. EXAM: CHEST - 2 VIEW COMPARISON:  07/26/2015 chest radiograph FINDINGS: Cardiomegaly and pulmonary vascular congestion noted. There is no evidence of focal airspace disease, pulmonary edema, suspicious pulmonary nodule/mass, pleural effusion, or pneumothorax. No acute bony abnormalities are identified.  IMPRESSION: Cardiomegaly with pulmonary vascular congestion. Electronically Signed   By: Margarette Canada M.D.   On: 02/03/2018 16:07   Dg Knee 1-2 Views Right  Result Date: 02/05/2018 CLINICAL DATA:  Swelling of the right knee for 1 month with pain, no acute abnormality. EXAM: RIGHT KNEE - 1-2 VIEW COMPARISON:  Right knee films of 01/04/2012 FINDINGS: There is little change in tricompartmental degenerative joint disease the right knee primarily involving the medial compartment where there is more loss of joint space and sclerosis present. No acute fracture is seen. There is a small amount of right knee joint fluid present. The patella appears normally positioned. IMPRESSION: 1. Tricompartmental degenerative joint disease the right knee primarily involving the medial compartment. 2. No acute fracture. 3. Small amount of fluid in the right knee joint space. Electronically Signed   By: Ivar Drape M.D.   On: 02/05/2018 11:47   Ct Head Wo Contrast  Result Date: 02/03/2018 CLINICAL DATA:  Weakness over the last week. Facial droop beginning yesterday. EXAM: CT HEAD WITHOUT CONTRAST TECHNIQUE:  Contiguous axial images were obtained from the base of the skull through the vertex without intravenous contrast. COMPARISON:  Brain MRI some 07/2012 FINDINGS: Brain: There is some area of cortical and subcortical low density in the insular region on the left that appears more prominent than was seen on previous studies. There could be recent infarction in that area. The remainder the brain shows atrophy and chronic small-vessel ischemic change of the white matter. No mass lesion, hemorrhage, hydrocephalus or extra-axial collection. Vascular: There is atherosclerotic calcification of the major vessels at the base of the brain. Skull: Negative Sinuses/Orbits: Clear/normal Other: None IMPRESSION: There is some low-density in the insular cortical and subcortical brain on the left that appears more prominent than on previous studies,  raising the possibility of extension of an infarction in the left insular region. Evidence of hemorrhage or mass effect. Atrophy and chronic small-vessel ischemic changes elsewhere. Electronically Signed   By: Nelson Chimes M.D.   On: 02/03/2018 15:47   Ct Angio Neck W Or Wo Contrast  Result Date: 02/04/2018 CLINICAL DATA:  Facial weakness EXAM: CT ANGIOGRAPHY HEAD AND NECK TECHNIQUE: Multidetector CT imaging of the head and neck was performed using the standard protocol during bolus administration of intravenous contrast. Multiplanar CT image reconstructions and MIPs were obtained to evaluate the vascular anatomy. Carotid stenosis measurements (when applicable) are obtained utilizing NASCET criteria, using the distal internal carotid diameter as the denominator. CONTRAST:  67mL ISOVUE-370 IOPAMIDOL (ISOVUE-370) INJECTION 76% COMPARISON:  Head CT 02/03/2018 FINDINGS: CT HEAD FINDINGS Brain: There is no mass, hemorrhage or extra-axial collection. The size and configuration of the ventricles and extra-axial CSF spaces are normal. There is no acute or chronic infarction. There is hypoattenuation of the periventricular white matter, most commonly indicating chronic ischemic microangiopathy. Skull: The visualized skull base, calvarium and extracranial soft tissues are normal. Sinuses/Orbits: No fluid levels or advanced mucosal thickening of the visualized paranasal sinuses. No mastoid or middle ear effusion. The orbits are normal. CTA NECK FINDINGS SKELETON: There is no bony spinal canal stenosis. No lytic or blastic lesion. OTHER NECK: Normal pharynx, larynx and major salivary glands. No cervical lymphadenopathy. Unremarkable thyroid gland. UPPER CHEST: No pneumothorax or pleural effusion. No nodules or masses. AORTIC ARCH: There is mild calcific atherosclerosis of the aortic arch. There is no aneurysm, dissection or hemodynamically significant stenosis of the visualized ascending aorta and aortic arch. Conventional  3 vessel aortic branching pattern. The visualized proximal subclavian arteries are widely patent. RIGHT CAROTID SYSTEM: --Common carotid artery: Widely patent origin without common carotid artery dissection or aneurysm. --Internal carotid artery: No dissection, occlusion or aneurysm. There is calcific atherosclerosis extending into the proximal ICA, resulting in less than 50% stenosis. Retropharyngeal course. --External carotid artery: No acute abnormality. LEFT CAROTID SYSTEM: --Common carotid artery: Widely patent origin without common carotid artery dissection or aneurysm. --Internal carotid artery: No dissection, occlusion or aneurysm. There is calcific atherosclerosis extending into the proximal ICA, resulting in 50% stenosis. Retropharyngeal course. --External carotid artery: No acute abnormality. VERTEBRAL ARTERIES: Right dominant configuration. Limited visualization of the origins. No dissection, occlusion or flow-limiting stenosis to the vertebrobasilar confluence. CTA HEAD FINDINGS ANTERIOR CIRCULATION: --Intracranial internal carotid arteries: Atherosclerotic calcification of the internal carotid arteries at the skull base without hemodynamically significant stenosis. --Anterior cerebral arteries: Normal. Both A1 segments are present. Patent anterior communicating artery. --Middle cerebral arteries: Normal. --Posterior communicating arteries: Absent bilaterally. POSTERIOR CIRCULATION: --Basilar artery: Normal. --Posterior cerebral arteries: Normal. --Superior cerebellar arteries: Normal. --Inferior cerebellar arteries: Normal  anterior and posterior inferior cerebellar arteries. VENOUS SINUSES: As permitted by contrast timing, patent. ANATOMIC VARIANTS: None DELAYED PHASE: No parenchymal contrast enhancement. Review of the MIP images confirms the above findings. IMPRESSION: 1. No emergent large vessel occlusion or other acute abnormality. 2. 50% stenosis of the proximal left internal carotid artery  secondary to calcific atherosclerosis. 3.  Aortic atherosclerosis (ICD10-I70.0). Electronically Signed   By: Ulyses Jarred M.D.   On: 02/04/2018 01:19   Dg Hand 2 View Left  Result Date: 02/04/2018 CLINICAL DATA:  Hand pain and spasms EXAM: LEFT HAND - 2 VIEW COMPARISON:  Left wrist 01/04/2012 FINDINGS: Diffuse interphalangeal osteoarthritis with mild joint space narrowing and spurring diffusely. No erosion. Mild degenerative change in the radiocarpal joint. Negative for fracture. IMPRESSION: Osteoarthritis. Electronically Signed   By: Franchot Gallo M.D.   On: 02/04/2018 13:06    Lab Data:  CBC: Recent Labs  Lab 02/03/18 1032 02/04/18 0347  WBC 7.0 7.6  HGB 12.3 11.6*  HCT 40.8 36.4  MCV 84.0 82.4  PLT 233 762   Basic Metabolic Panel: Recent Labs  Lab 02/04/18 0347 02/05/18 0323 02/06/18 0405 02/06/18 1233 02/07/18 0546  NA 138 137 134* 134* 138  K 3.7 3.5 4.0 4.2 4.0  CL 102 100 100 98 104  CO2 31 27 26 29 28   GLUCOSE 241* 171* 126* 167* 110*  BUN 18 20 30* 28* 27*  CREATININE 1.31* 1.43* 1.81* 1.81* 1.51*  CALCIUM 8.8* 8.4* 8.3* 8.4* 7.9*  MG 1.7  --   --   --   --    GFR: Estimated Creatinine Clearance: 45.9 mL/min (A) (by C-G formula based on SCr of 1.51 mg/dL (H)). Liver Function Tests: Recent Labs  Lab 02/03/18 1515  AST 14*  ALT 14  ALKPHOS 65  BILITOT 0.5  PROT 7.3  ALBUMIN 3.2*   No results for input(s): LIPASE, AMYLASE in the last 168 hours. No results for input(s): AMMONIA in the last 168 hours. Coagulation Profile: No results for input(s): INR, PROTIME in the last 168 hours. Cardiac Enzymes: Recent Labs  Lab 02/03/18 1515  TROPONINI <0.03   BNP (last 3 results) No results for input(s): PROBNP in the last 8760 hours. HbA1C: No results for input(s): HGBA1C in the last 72 hours. CBG: Recent Labs  Lab 02/07/18 1105 02/07/18 1628 02/07/18 2106 02/08/18 0631 02/08/18 1110  GLUCAP 163* 141* 120* 114* 137*   Lipid Profile: No results  for input(s): CHOL, HDL, LDLCALC, TRIG, CHOLHDL, LDLDIRECT in the last 72 hours. Thyroid Function Tests: No results for input(s): TSH, T4TOTAL, FREET4, T3FREE, THYROIDAB in the last 72 hours. Anemia Panel: No results for input(s): VITAMINB12, FOLATE, FERRITIN, TIBC, IRON, RETICCTPCT in the last 72 hours. Urine analysis:    Component Value Date/Time   COLORURINE YELLOW 02/03/2018 1155   APPEARANCEUR CLEAR 02/03/2018 1155   LABSPEC 1.015 02/03/2018 1155   PHURINE 5.0 02/03/2018 1155   GLUCOSEU NEGATIVE 02/03/2018 1155   HGBUR SMALL (A) 02/03/2018 1155   BILIRUBINUR NEGATIVE 02/03/2018 1155   KETONESUR NEGATIVE 02/03/2018 1155   PROTEINUR 100 (A) 02/03/2018 1155   UROBILINOGEN 1.0 07/31/2013 1359   NITRITE NEGATIVE 02/03/2018 1155   LEUKOCYTESUR NEGATIVE 02/03/2018 1155     Marli Diego M.D. [facility] 02/08/2018, 12:30 PM  Pager: [phone] Between 7am to 7pm - call Pager - [phone]  After 7pm go to [email or web address] - password [email or web address]  Call night coverage person covering after 7pm

## 2018-02-09 DIAGNOSIS — E1159 Type 2 diabetes mellitus with other circulatory complications: Secondary | ICD-10-CM | POA: Diagnosis not present

## 2018-02-09 DIAGNOSIS — R52 Pain, unspecified: Secondary | ICD-10-CM

## 2018-02-09 DIAGNOSIS — E0801 Diabetes mellitus due to underlying condition with hyperosmolarity with coma: Secondary | ICD-10-CM | POA: Diagnosis not present

## 2018-02-09 DIAGNOSIS — J810 Acute pulmonary edema: Secondary | ICD-10-CM | POA: Diagnosis not present

## 2018-02-09 DIAGNOSIS — G459 Transient cerebral ischemic attack, unspecified: Secondary | ICD-10-CM | POA: Diagnosis not present

## 2018-02-09 LAB — GLUCOSE, CAPILLARY
GLUCOSE-CAPILLARY: 166 mg/dL — AB (ref 70–99)
Glucose-Capillary: 136 mg/dL — ABNORMAL HIGH (ref 70–99)
Glucose-Capillary: 125 mg/dL — ABNORMAL HIGH (ref 70–99)
Glucose-Capillary: 113 mg/dL — ABNORMAL HIGH (ref 70–99)

## 2018-02-09 NOTE — Progress Notes (Signed)
Patient refused pneumonia vaccine and influenza vaccine. Verbalized understanding and able to teach back

## 2018-02-09 NOTE — Progress Notes (Signed)
[facility]                                                                              Patient Demographics  Bianca Barnett, is a 77 y.o. female, DOB - Jan 29, 1941, ZTI:[id number]  Admit date - 02/03/2018   Admitting Physician Lenore Cordia, MD  Outpatient Primary MD for the patient is No primary care provider on file.  Outpatient specialists:   LOS - 0  days   Medical records reviewed and are as summarized below:    Chief Complaint  Patient presents with  . Weakness       Brief summary   Patient is a 77 year old female with type 2 diabetes, hypertension, hyperlipidemia, morbid obesity presented with lower extremity swelling over the last 2 weeks, no shortness of breath but wakes up in the middle of the night with palpitations.  No chest pain, orthopnea or PND.  She reported drinking a lot of fluids.  She experienced an episode of left-sided facial droop day prior to admission with some slurred speech, symptoms lasted 1 to 1.5 hours and resolved.  Patient returned back to her baseline.  Patient was admitted for further work-up.   Assessment & Plan    Principal Problem:   TIA (transient ischemic attack) -Suspected to have TIA with transient left facial droop and slurred speech however was not able to undergo MRI brain due to body habitus.  Currently left facial droop and slurred speech resolved. -Neurology was consulted in the ED who recommended CT angiogram of the head and neck which showed no emergent large vessel occlusion or other acute abnormality, 50% stenosis of the proximal left ICA secondary to calcific atherosclerosis. -Continue aspirin, statin -LDL 107, continue statin Hemoglobin A1c 8.3 2D echo showed EF of 55 to 60% with grade 1 diastolic dysfunction. PT evaluation recommended SNF, awaiting SNF bed, awaiting insurance authorization No acute issues, awaiting SNF bed   Active Problems: Peripheral edema in the last 2 weeks -Improved, IV Lasix was  discontinued as creatinine started to trend up.   2D echo showed EF of 55 to 60% with grade 1 diastolic dysfunction  Mild acute kidney injury Likely due to IV diuresis, creatinine 1.8 on 11/8, Lasix was discontinued on 11/7. Patient was placed on gentle hydration, continue to hold Avapro IV fluids have been discontinued, creatinine has been trending down, 1.5 on 11/9    DM (diabetes mellitus) ([place]) -Hemoglobin A1c 8.3,  -CBG's stable.  - continue Lantus, meal coverage, sliding scale     Hypertension associated with diabetes ([place]) -BP improving, continue hydralazine   continue to hold Avapro, Lasix   carpal tunnel syndrome, left hand -Continue splint -Continue Neurontin to 200 mg twice a day -Left hand x-ray showed osteoarthritis  Right knee pain X-ray showed severe osteoarthritis  Acromioclavicular arthropathy Continue pain control, PT  Morbid obesity BMI 67.1, counseled on diet and weight control   Code Status: DNR DVT Prophylaxis: Lovenox Family Communication: Discussed in detail with the patient, all imaging results, lab results explained to the patient and granddaughter at the bedside   Disposition Plan: awaiting insurance authorization   Time Spent in minutes  15  minutes  Procedures:  CT angiogram head and neck 2D echo  Consultants:   None  Antimicrobials:      Medications  Scheduled Meds: . aspirin EC  81 mg Oral Daily  . atorvastatin  40 mg Oral q1800  . enoxaparin (LOVENOX) injection  75 mg Subcutaneous QHS  . gabapentin  200 mg Oral BID  . hydrALAZINE  25 mg Oral BID  . hydrOXYzine  50 mg Oral QHS  . Influenza vac split quadrivalent PF  0.5 mL Intramuscular Tomorrow-1000  . insulin aspart  0-15 Units Subcutaneous TID WC  . insulin aspart  4 Units Subcutaneous TID WC  . insulin glargine  24 Units Subcutaneous QHS  . pneumococcal 23 valent vaccine  0.5 mL Intramuscular Tomorrow-1000  . sodium chloride flush  3 mL Intravenous Q12H    Continuous Infusions: . sodium chloride Stopped (02/07/18 0551)   PRN Meds:.acetaminophen **OR** acetaminophen, albuterol, meclizine, senna-docusate   Antibiotics   Anti-infectives (From admission, onward)   None        Subjective:   Quandra Seville was seen and examined today.  No complaints, awaiting snf bed. No chest pain shortness breath, fevers or chills.    Objective:   Vitals:   02/08/18 2347 02/09/18 0350 02/09/18 0754 02/09/18 1130  BP: 124/69 (!) 156/76 (!) 149/79 133/65  Pulse: 71 61 76 67  Resp: 18 20 20 16   Temp: 98 F (36.7 C) 97.7 F (36.5 C) 98.1 F (36.7 C) 98.4 F (36.9 C)  TempSrc: Oral Oral Oral Oral  SpO2: 96% 99% 98% 95%  Weight:      Height:        Intake/Output Summary (Last 24 hours) at 02/09/2018 1517 Last data filed at 02/09/2018 1403 Gross per 24 hour  Intake 360 ml  Output 1050 ml  Net -690 ml     Wt Readings from Last 3 Encounters:  02/06/18 (!) 157.4 kg  08/25/16 (!) 157.4 kg  08/09/15 (!) 155.1 kg     Exam   General: Alert and oriented x 3, NAD Eyes:  HEENT:   Cardiovascular: S1 S2 auscultated, RRR. No pedal edema b/l Respiratory: Clear to auscultation bilaterally Gastrointestinal: Soft, nontender, nondistended, + bowel sounds Ext: no pedal edema bilaterally Neuro: no new deficits Musculoskeletal:  Skin: No rashes Psych: Normal affect and demeanor, alert and oriented x3     Data Reviewed:  I have personally reviewed following labs and imaging studies  Micro Results No results found for this or any previous visit (from the past 240 hour(s)).  Radiology Reports Ct Angio Head W/cm &/or Wo Cm  Result Date: 02/04/2018 CLINICAL DATA:  Facial weakness EXAM: CT ANGIOGRAPHY HEAD AND NECK TECHNIQUE: Multidetector CT imaging of the head and neck was performed using the standard protocol during bolus administration of intravenous contrast. Multiplanar CT image reconstructions and MIPs were obtained to evaluate the  vascular anatomy. Carotid stenosis measurements (when applicable) are obtained utilizing NASCET criteria, using the distal internal carotid diameter as the denominator. CONTRAST:  61mL ISOVUE-370 IOPAMIDOL (ISOVUE-370) INJECTION 76% COMPARISON:  Head CT 02/03/2018 FINDINGS: CT HEAD FINDINGS Brain: There is no mass, hemorrhage or extra-axial collection. The size and configuration of the ventricles and extra-axial CSF spaces are normal. There is no acute or chronic infarction. There is hypoattenuation of the periventricular white matter, most commonly indicating chronic ischemic microangiopathy. Skull: The visualized skull base, calvarium and extracranial soft tissues are normal. Sinuses/Orbits: No fluid levels or advanced mucosal thickening of the visualized paranasal  sinuses. No mastoid or middle ear effusion. The orbits are normal. CTA NECK FINDINGS SKELETON: There is no bony spinal canal stenosis. No lytic or blastic lesion. OTHER NECK: Normal pharynx, larynx and major salivary glands. No cervical lymphadenopathy. Unremarkable thyroid gland. UPPER CHEST: No pneumothorax or pleural effusion. No nodules or masses. AORTIC ARCH: There is mild calcific atherosclerosis of the aortic arch. There is no aneurysm, dissection or hemodynamically significant stenosis of the visualized ascending aorta and aortic arch. Conventional 3 vessel aortic branching pattern. The visualized proximal subclavian arteries are widely patent. RIGHT CAROTID SYSTEM: --Common carotid artery: Widely patent origin without common carotid artery dissection or aneurysm. --Internal carotid artery: No dissection, occlusion or aneurysm. There is calcific atherosclerosis extending into the proximal ICA, resulting in less than 50% stenosis. Retropharyngeal course. --External carotid artery: No acute abnormality. LEFT CAROTID SYSTEM: --Common carotid artery: Widely patent origin without common carotid artery dissection or aneurysm. --Internal carotid  artery: No dissection, occlusion or aneurysm. There is calcific atherosclerosis extending into the proximal ICA, resulting in 50% stenosis. Retropharyngeal course. --External carotid artery: No acute abnormality. VERTEBRAL ARTERIES: Right dominant configuration. Limited visualization of the origins. No dissection, occlusion or flow-limiting stenosis to the vertebrobasilar confluence. CTA HEAD FINDINGS ANTERIOR CIRCULATION: --Intracranial internal carotid arteries: Atherosclerotic calcification of the internal carotid arteries at the skull base without hemodynamically significant stenosis. --Anterior cerebral arteries: Normal. Both A1 segments are present. Patent anterior communicating artery. --Middle cerebral arteries: Normal. --Posterior communicating arteries: Absent bilaterally. POSTERIOR CIRCULATION: --Basilar artery: Normal. --Posterior cerebral arteries: Normal. --Superior cerebellar arteries: Normal. --Inferior cerebellar arteries: Normal anterior and posterior inferior cerebellar arteries. VENOUS SINUSES: As permitted by contrast timing, patent. ANATOMIC VARIANTS: None DELAYED PHASE: No parenchymal contrast enhancement. Review of the MIP images confirms the above findings. IMPRESSION: 1. No emergent large vessel occlusion or other acute abnormality. 2. 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 3.  Aortic atherosclerosis (ICD10-I70.0). Electronically Signed   By: Ulyses Jarred M.D.   On: 02/04/2018 01:19   Dg Chest 2 View  Result Date: 02/03/2018 CLINICAL DATA:  Diffuse body swelling and tachycardia. Wheezing for 2 months. EXAM: CHEST - 2 VIEW COMPARISON:  07/26/2015 chest radiograph FINDINGS: Cardiomegaly and pulmonary vascular congestion noted. There is no evidence of focal airspace disease, pulmonary edema, suspicious pulmonary nodule/mass, pleural effusion, or pneumothorax. No acute bony abnormalities are identified. IMPRESSION: Cardiomegaly with  pulmonary vascular congestion. Electronically Signed   By: Margarette Canada M.D.   On: 02/03/2018 16:07   Dg Knee 1-2 Views Right  Result Date: 02/05/2018 CLINICAL DATA:  Swelling of the right knee for 1 month with pain, no acute abnormality. EXAM: RIGHT KNEE - 1-2 VIEW COMPARISON:  Right knee films of 01/04/2012 FINDINGS: There is little change in tricompartmental degenerative joint disease the right knee primarily involving the medial compartment where there is more loss of joint space and sclerosis present. No acute fracture is seen. There is a small amount of right knee joint fluid present. The patella appears normally positioned. IMPRESSION: 1. Tricompartmental degenerative joint disease the right knee primarily involving the medial compartment. 2. No acute fracture. 3. Small amount of fluid in the right knee joint space. Electronically Signed   By: Ivar Drape M.D.   On: 02/05/2018 11:47   Ct Head Wo Contrast  Result Date: 02/03/2018 CLINICAL DATA:  Weakness over the last week. Facial droop beginning yesterday. EXAM: CT HEAD WITHOUT CONTRAST TECHNIQUE: Contiguous axial images were obtained from the base of  the skull through the vertex without intravenous contrast. COMPARISON:  Brain MRI some 07/2012 FINDINGS: Brain: There is some area of cortical and subcortical low density in the insular region on the left that appears more prominent than was seen on previous studies. There could be recent infarction in that area. The remainder the brain shows atrophy and chronic small-vessel ischemic change of the white matter. No mass lesion, hemorrhage, hydrocephalus or extra-axial collection. Vascular: There is atherosclerotic calcification of the major vessels at the base of the brain. Skull: Negative Sinuses/Orbits: Clear/normal Other: None IMPRESSION: There is some low-density in the insular cortical and subcortical brain on the left that appears more prominent than on previous studies, raising the possibility of  extension of an infarction in the left insular region. Evidence of hemorrhage or mass effect. Atrophy and chronic small-vessel ischemic changes elsewhere. Electronically Signed   By: Nelson Chimes M.D.   On: 02/03/2018 15:47   Ct Angio Neck W Or Wo Contrast  Result Date: 02/04/2018 CLINICAL DATA:  Facial weakness EXAM: CT ANGIOGRAPHY HEAD AND NECK TECHNIQUE: Multidetector CT imaging of the head and neck was performed using the standard protocol during bolus administration of intravenous contrast. Multiplanar CT image reconstructions and MIPs were obtained to evaluate the vascular anatomy. Carotid stenosis measurements (when applicable) are obtained utilizing NASCET criteria, using the distal internal carotid diameter as the denominator. CONTRAST:  7mL ISOVUE-370 IOPAMIDOL (ISOVUE-370) INJECTION 76% COMPARISON:  Head CT 02/03/2018 FINDINGS: CT HEAD FINDINGS Brain: There is no mass, hemorrhage or extra-axial collection. The size and configuration of the ventricles and extra-axial CSF spaces are normal. There is no acute or chronic infarction. There is hypoattenuation of the periventricular white matter, most commonly indicating chronic ischemic microangiopathy. Skull: The visualized skull base, calvarium and extracranial soft tissues are normal. Sinuses/Orbits: No fluid levels or advanced mucosal thickening of the visualized paranasal sinuses. No mastoid or middle ear effusion. The orbits are normal. CTA NECK FINDINGS SKELETON: There is no bony spinal canal stenosis. No lytic or blastic lesion. OTHER NECK: Normal pharynx, larynx and major salivary glands. No cervical lymphadenopathy. Unremarkable thyroid gland. UPPER CHEST: No pneumothorax or pleural effusion. No nodules or masses. AORTIC ARCH: There is mild calcific atherosclerosis of the aortic arch. There is no aneurysm, dissection or hemodynamically significant stenosis of the visualized ascending aorta and aortic arch. Conventional 3 vessel aortic branching  pattern. The visualized proximal subclavian arteries are widely patent. RIGHT CAROTID SYSTEM: --Common carotid artery: Widely patent origin without common carotid artery dissection or aneurysm. --Internal carotid artery: No dissection, occlusion or aneurysm. There is calcific atherosclerosis extending into the proximal ICA, resulting in less than 50% stenosis. Retropharyngeal course. --External carotid artery: No acute abnormality. LEFT CAROTID SYSTEM: --Common carotid artery: Widely patent origin without common carotid artery dissection or aneurysm. --Internal carotid artery: No dissection, occlusion or aneurysm. There is calcific atherosclerosis extending into the proximal ICA, resulting in 50% stenosis. Retropharyngeal course. --External carotid artery: No acute abnormality. VERTEBRAL ARTERIES: Right dominant configuration. Limited visualization of the origins. No dissection, occlusion or flow-limiting stenosis to the vertebrobasilar confluence. CTA HEAD FINDINGS ANTERIOR CIRCULATION: --Intracranial internal carotid arteries: Atherosclerotic calcification of the internal carotid arteries at the skull base without hemodynamically significant stenosis. --Anterior cerebral arteries: Normal. Both A1 segments are present. Patent anterior communicating artery. --Middle cerebral arteries: Normal. --Posterior communicating arteries: Absent bilaterally. POSTERIOR CIRCULATION: --Basilar artery: Normal. --Posterior cerebral arteries: Normal. --Superior cerebellar arteries: Normal. --Inferior cerebellar arteries: Normal anterior and posterior inferior cerebellar arteries. VENOUS SINUSES: As  permitted by contrast timing, patent. ANATOMIC VARIANTS: None DELAYED PHASE: No parenchymal contrast enhancement. Review of the MIP images confirms the above findings. IMPRESSION: 1. No emergent large vessel occlusion or other acute abnormality. 2. 50% stenosis of the proximal left internal carotid artery secondary to calcific  atherosclerosis. 3.  Aortic atherosclerosis (ICD10-I70.0). Electronically Signed   By: Ulyses Jarred M.D.   On: 02/04/2018 01:19   Dg Hand 2 View Left  Result Date: 02/04/2018 CLINICAL DATA:  Hand pain and spasms EXAM: LEFT HAND - 2 VIEW COMPARISON:  Left wrist 01/04/2012 FINDINGS: Diffuse interphalangeal osteoarthritis with mild joint space narrowing and spurring diffusely. No erosion. Mild degenerative change in the radiocarpal joint. Negative for fracture. IMPRESSION: Osteoarthritis. Electronically Signed   By: Franchot Gallo M.D.   On: 02/04/2018 13:06    Lab Data:  CBC: Recent Labs  Lab 02/03/18 1032 02/04/18 0347  WBC 7.0 7.6  HGB 12.3 11.6*  HCT 40.8 36.4  MCV 84.0 82.4  PLT 233 956   Basic Metabolic Panel: Recent Labs  Lab 02/04/18 0347 02/05/18 0323 02/06/18 0405 02/06/18 1233 02/07/18 0546  NA 138 137 134* 134* 138  K 3.7 3.5 4.0 4.2 4.0  CL 102 100 100 98 104  CO2 31 27 26 29 28   GLUCOSE 241* 171* 126* 167* 110*  BUN 18 20 30* 28* 27*  CREATININE 1.31* 1.43* 1.81* 1.81* 1.51*  CALCIUM 8.8* 8.4* 8.3* 8.4* 7.9*  MG 1.7  --   --   --   --    GFR: Estimated Creatinine Clearance: 45.9 mL/min (A) (by C-G formula based on SCr of 1.51 mg/dL (H)). Liver Function Tests: Recent Labs  Lab 02/03/18 1515  AST 14*  ALT 14  ALKPHOS 65  BILITOT 0.5  PROT 7.3  ALBUMIN 3.2*   No results for input(s): LIPASE, AMYLASE in the last 168 hours. No results for input(s): AMMONIA in the last 168 hours. Coagulation Profile: No results for input(s): INR, PROTIME in the last 168 hours. Cardiac Enzymes: Recent Labs  Lab 02/03/18 1515  TROPONINI <0.03   BNP (last 3 results) No results for input(s): PROBNP in the last 8760 hours. HbA1C: No results for input(s): HGBA1C in the last 72 hours. CBG: Recent Labs  Lab 02/08/18 1110 02/08/18 1606 02/08/18 2116 02/09/18 0627 02/09/18 1128  GLUCAP 137* 113* 159* 166* 136*   Lipid Profile: No results for input(s): CHOL, HDL,  LDLCALC, TRIG, CHOLHDL, LDLDIRECT in the last 72 hours. Thyroid Function Tests: No results for input(s): TSH, T4TOTAL, FREET4, T3FREE, THYROIDAB in the last 72 hours. Anemia Panel: No results for input(s): VITAMINB12, FOLATE, FERRITIN, TIBC, IRON, RETICCTPCT in the last 72 hours. Urine analysis:    Component Value Date/Time   COLORURINE YELLOW 02/03/2018 1155   APPEARANCEUR CLEAR 02/03/2018 1155   LABSPEC 1.015 02/03/2018 1155   PHURINE 5.0 02/03/2018 1155   GLUCOSEU NEGATIVE 02/03/2018 1155   HGBUR SMALL (A) 02/03/2018 1155   BILIRUBINUR NEGATIVE 02/03/2018 1155   KETONESUR NEGATIVE 02/03/2018 1155   PROTEINUR 100 (A) 02/03/2018 1155   UROBILINOGEN 1.0 07/31/2013 1359   NITRITE NEGATIVE 02/03/2018 1155   LEUKOCYTESUR NEGATIVE 02/03/2018 1155     Nyonna Hargrove M.D. [facility] 02/09/2018, 3:17 PM  Pager: [phone] Between 7am to 7pm - call Pager - [phone]  After 7pm go to [email or web address] - password [email or web address]  Call night coverage person covering after 7pm

## 2018-02-09 NOTE — Progress Notes (Signed)
Physical Therapy Treatment Patient Details Name: Bianca Barnett MRN: [id number] DOB: Aug 11, 1940 Today's Date: 02/09/2018    History of Present Illness Pt is a 77 y.o female with a PMH consisting of vertigo, type 2 diabetes, hypertension, morbid obesity, anxiety, ovarian cancer, night time palpitations and chronic neck pain. Pt reports to ED with L sided facial droop with slurred speech, and LE swelling. Pt also reports numbness in bilateral hands. CT of the head on 02/03/2018 shows low-density in the L insular cortical and subcortical areas with evidence of hemorrhage or mass effect. Pt unable to undergo MRI brain due to body habitus.    PT Comments    Pt was in bed with no family present upon arrival. Pt was agreeable to therapy, but preferred to stay in bed at end of session instead of transferring to chair.  She was very talkative this session, and needed constant reminders to stay on task. Pt required Max A +2 this session for bed mobility. Once on EOB pt reported feeling vertigo like symptoms. Once symptoms subsided she required Min guard with bil LE and UE for support when sitting EOB. At end of session pt requested pericare due to irritation from [place]. Pt was limited this session due to pain and vertigo and presents with self limiting behavior. Pt is making slow progress toward stated goals due to stated reasons. Pt remains appropriate for D/C to SNF based on current functional status.   Follow Up Recommendations  SNF     Equipment Recommendations  None recommended by PT    Recommendations for Other Services       Precautions / Restrictions Precautions Precautions: Fall Restrictions Weight Bearing Restrictions: No    Mobility  Bed Mobility Overal bed mobility: Needs Assistance Bed Mobility: Supine to Sit;Sit to Sidelying     Supine to sit: Max assist;+2 for physical assistance;HOB elevated   Sit to sidelying: Max assist;+2 for physical assistance;HOB elevated General  bed mobility comments: Pt was hesitant to participate in bed mobility. Pt required Max A + 2 to power up into sitting and get hips positioned on EOB. Pt required assist to progress LE on and off EOB. Once in bed pt required Max A +2 to reposition.  Transfers                 General transfer comment: deferred  Ambulation/Gait                 Stairs             Wheelchair Mobility    Modified Rankin (Stroke Patients Only)       Balance Overall balance assessment: Needs assistance Sitting-balance support: Feet supported;Bilateral upper extremity supported Sitting balance-Leahy Scale: Poor Sitting balance - Comments: Pt requried Min guard once sitting on EOB with bil LE and UE support. Pt stated vertgio like symptoms occurred during supine to sit, that subsided. Pt expressed a fear of falling forward when asked to reach outside BOS, stating that she can not reach forward with "all this weight". She lifted up her gown and showed the therapist her body and stated "this is why I can't bend".                                     Cognition Arousal/Alertness: Awake/alert Behavior During Therapy: WFL for tasks assessed/performed Overall Cognitive Status: Within Functional Limits for tasks assessed  General Comments: Pt refused to sit in the chair this session. Pt reported a bout of vertio upon sitting on EOB-no nystagmus noted.      Exercises Total Joint Exercises Ankle Circles/Pumps: AROM;15 reps;Right;Left;Supine Heel Slides: AROM;10 reps;Right;Left;Other (comment)(pt reported R knee pain) Hip ABduction/ADduction: AROM;10 reps;Left;Supine;Right    General Comments General comments (skin integrity, edema, etc.): Pt c/o of irritation with removal of [place]. At end of session therapist applied cream to irritated skin. RN notified.      Pertinent Vitals/Pain Faces Pain Scale: Hurts a little bit Pain  Location: R knee Pain Descriptors / Indicators: Aching;Discomfort Pain Intervention(s): Limited activity within patient's tolerance;Monitored during session;Repositioned    Home Living                      Prior Function            PT Goals (current goals can now be found in the care plan section) Acute Rehab PT Goals Patient Stated Goal: To return to previous function    PT Goal Formulation: With patient Time For Goal Achievement: 02/18/18 Potential to Achieve Goals: Fair Progress towards PT goals: Not progressing toward goals - comment(due to pt's reports of vertigo and pain progress has been delayed.)    Frequency    Min 3X/week      PT Plan Current plan remains appropriate    Co-evaluation              AM-PAC PT "6 Clicks" Daily Activity  Outcome Measure  Difficulty turning over in bed (including adjusting bedclothes, sheets and blankets)?: Unable Difficulty moving from lying on back to sitting on the side of the bed? : A Lot Difficulty sitting down on and standing up from a chair with arms (e.g., wheelchair, bedside commode, etc,.)?: Unable Help needed moving to and from a bed to chair (including a wheelchair)?: A Lot Help needed walking in hospital room?: Total Help needed climbing 3-5 steps with a railing? : Total 6 Click Score: 8    End of Session   Activity Tolerance: Patient limited by fatigue;Patient limited by pain Patient left: in bed;with call bell/phone within reach;with bed alarm set   PT Visit Diagnosis: Muscle weakness (generalized) (M62.81);Other abnormalities of gait and mobility (R26.89);Other symptoms and signs involving the nervous system (R29.898)     Time: 4765-4650 PT Time Calculation (min) (ACUTE ONLY): 35 min  Charges:  $Therapeutic Activity: 23-37 mins                    [address], SPTA   [place] 02/09/2018, 2:30 PM

## 2018-02-10 DIAGNOSIS — J810 Acute pulmonary edema: Secondary | ICD-10-CM | POA: Diagnosis not present

## 2018-02-10 DIAGNOSIS — M7989 Other specified soft tissue disorders: Secondary | ICD-10-CM | POA: Diagnosis not present

## 2018-02-10 DIAGNOSIS — G459 Transient cerebral ischemic attack, unspecified: Secondary | ICD-10-CM | POA: Diagnosis not present

## 2018-02-10 DIAGNOSIS — E1159 Type 2 diabetes mellitus with other circulatory complications: Secondary | ICD-10-CM | POA: Diagnosis not present

## 2018-02-10 LAB — CBC
HCT: 37.2 % (ref 36.0–46.0)
Hemoglobin: 11.6 g/dL — ABNORMAL LOW (ref 12.0–15.0)
MCH: 26.2 pg (ref 26.0–34.0)
MCHC: 31.2 g/dL (ref 30.0–36.0)
MCV: 84 fL (ref 80.0–100.0)
NRBC: 0 % (ref 0.0–0.2)
PLATELETS: 200 10*3/uL (ref 150–400)
RBC: 4.43 MIL/uL (ref 3.87–5.11)
RDW: 14.6 % (ref 11.5–15.5)
WBC: 7 10*3/uL (ref 4.0–10.5)

## 2018-02-10 LAB — GLUCOSE, CAPILLARY
GLUCOSE-CAPILLARY: 108 mg/dL — AB (ref 70–99)
Glucose-Capillary: 134 mg/dL — ABNORMAL HIGH (ref 70–99)

## 2018-02-10 LAB — CREATININE, SERUM
CREATININE: 1.39 mg/dL — AB (ref 0.44–1.00)
GFR calc non Af Amer: 36 mL/min — ABNORMAL LOW (ref >60)
GFR, EST AFRICAN AMERICAN: 42 mL/min — AB (ref >60)

## 2018-02-10 NOTE — Discharge Summary (Signed)
Physician Discharge Summary   Patient ID: Bianca Barnett MRN: [id number] DOB/AGE: [birth year] 75 y.o.  Admit date: 02/03/2018 Discharge date: 02/10/2018  Primary Care Physician:  No primary care provider on file.   Recommendations for Outpatient Follow-up:  1. Follow up with PCP in 1-2 weeks 2. Please start Avapro once creatinine normalized  Home Health: Patient is being discharged to skilled nursing facility Equipment/Devices:   Discharge Condition: stable CODE STATUS: DNR Diet recommendation: Carb modified heart healthy diet, fluid restriction 2000 cc / 24 hours   Discharge Diagnoses:    . TIA (transient ischemic attack) . Hypertension associated with diabetes ([place]) Hyperlipidemia Peripheral edema/mild acute on chronic diastolic CHF Mild acute kidney injury likely due to diuretics Diabetes mellitus type 2, insulin-dependent, uncontrolled Carpal tunnel syndrome left hand Right knee osteoarthritis Morbid obesity  Consults: None    Allergies:   Allergies  Allergen Reactions  . Adhesive [Tape] Other (See Comments)    BLISTERS "MUST USE PAPER TAPE"   . Aspirin Other (See Comments)    "Imbalance"  . Codeine Other (See Comments)    Per patient, confirmed on an allergy test.  . Latex Other (See Comments)    Blisters   . Lisinopril Cough  . Penicillins Diarrhea and Nausea And Vomiting    All of the cillins. Has patient had a PCN reaction causing immediate rash, facial/tongue/throat swelling, SOB or lightheadedness with hypotension: No Has patient had a PCN reaction causing severe rash involving mucus membranes or skin necrosis: No Has patient had a PCN reaction that required hospitalization No Has patient had a PCN reaction occurring within the last 10 years: Yes If all of the above answers are "NO", then may proceed with Cephalosporin use.   . Procaine Rash     DISCHARGE MEDICATIONS: Allergies as of 02/10/2018      Reactions   Adhesive [tape] Other (See  Comments)   BLISTERS "MUST USE PAPER TAPE"   Aspirin Other (See Comments)   "Imbalance"   Codeine Other (See Comments)   Per patient, confirmed on an allergy test.   Latex Other (See Comments)   Blisters    Lisinopril Cough   Penicillins Diarrhea, Nausea And Vomiting   All of the cillins. Has patient had a PCN reaction causing immediate rash, facial/tongue/throat swelling, SOB or lightheadedness with hypotension: No Has patient had a PCN reaction causing severe rash involving mucus membranes or skin necrosis: No Has patient had a PCN reaction that required hospitalization No Has patient had a PCN reaction occurring within the last 10 years: Yes If all of the above answers are "NO", then may proceed with Cephalosporin use.   Procaine Rash      Medication List    STOP taking these medications   telmisartan 40 MG tablet Commonly known as:  MICARDIS     TAKE these medications   albuterol 108 (90 Base) MCG/ACT inhaler Commonly known as:  PROVENTIL HFA;VENTOLIN HFA Inhale 1-2 puffs into the lungs every 6 (six) hours as needed for wheezing or shortness of breath.   aspirin 81 MG EC tablet Take 1 tablet (81 mg total) by mouth daily.   atorvastatin 40 MG tablet Commonly known as:  LIPITOR Take 1 tablet (40 mg total) by mouth daily at 6 PM.   furosemide 40 MG tablet Commonly known as:  LASIX Take 20-40 mg by mouth as needed for fluid. For swelling   gabapentin 100 MG capsule Commonly known as:  NEURONTIN Take 2 capsules (200 mg total) by mouth  2 (two) times daily. What changed:  when to take this   hydrALAZINE 25 MG tablet Commonly known as:  APRESOLINE Take 1 tablet (25 mg total) by mouth 2 (two) times daily.   hydrOXYzine 25 MG tablet Commonly known as:  ATARAX/VISTARIL Take 50 mg by mouth at bedtime.   insulin aspart 100 UNIT/ML injection Commonly known as:  novoLOG Inject 0-9 Units into the skin 3 (three) times daily with meals. Sliding scale CBG 70 - 120: 0 units  CBG 121 - 150: 1 unit,  CBG 151 - 200: 2 units,  CBG 201 - 250: 3 units,  CBG 251 - 300: 5 units,  CBG 301 - 350: 7 units,  CBG 351 - 400: 9 units   CBG > 400: 9 units and notify your MD   insulin glargine 100 UNIT/ML injection Commonly known as:  LANTUS Inject 0.24 mLs (24 Units total) into the skin at bedtime. What changed:    when to take this  reasons to take this  additional instructions   meclizine 25 MG tablet Commonly known as:  ANTIVERT Take 1 tablet (25 mg total) by mouth 3 (three) times daily as needed for dizziness.   ondansetron 4 MG tablet Commonly known as:  ZOFRAN Take 1 tablet (4 mg total) by mouth every 6 (six) hours.   prednisoLONE acetate 1 % ophthalmic suspension Commonly known as:  PRED FORTE Place 1 drop into both eyes as needed (if show signs of infections).   senna-docusate 8.6-50 MG tablet Commonly known as:  Senokot-S Take 1 tablet by mouth at bedtime as needed for mild constipation.   triamcinolone cream 0.5 % Commonly known as:  KENALOG Apply twice a day to the rash on your feet.   Vitamin D3 125 MCG (5000 UT) Caps Take 5,000 Units by mouth daily.        Brief H and P: For complete details please refer to admission H and P, but in briefPatient is a 77 year old female with type 2 diabetes, hypertension, hyperlipidemia, morbid obesity presented with lower extremity swelling over the last 2 weeks, no shortness of breath but wakes up in the middle of the night with palpitations.  No chest pain, orthopnea or PND.  She reported drinking a lot of fluids.  She experienced an episode of left-sided facial droop day prior to admission with some slurred speech, symptoms lasted 1 to 1.5 hours and resolved.  Patient returned back to her baseline.  Patient was admitted for further work-up.   Hospital Course:   TIA (transient ischemic attack) -Suspected to have TIA with transient left facial droop and slurred speech however was not able to undergo MRI brain  due to body habitus.  Currently left facial droop and slurred speech resolved. -Neurology was consulted in the ED who recommended CT angiogram of the head and neck which showed no emergent large vessel occlusion or other acute abnormality, 50% stenosis of the proximal left ICA secondary to calcific atherosclerosis. -Continue aspirin 81 mg daily, Lipitor.  ot on aspirin or statin prior to admission -LDL 107, continue statin Hemoglobin A1c 8.3 2D echo showed EF of 55 to 60% with grade 1 diastolic dysfunction.   Peripheral edema in the last 2 weeks, mild acute on chronic diastolic CHF -Improved, initially patient was placed on IV Lasix which was discontinued as creatinine started to trend up. 2D echo showed EF of 55 to 60% with grade 1 diastolic dysfunction  Mild acute kidney injury Likely due to IV diuresis, creatinine  1.8 on 11/8, Lasix was discontinued on 11/7. Avapro was held, patient received gentle hydration, creatinine has now improved to 1.3 on the day of discharge.     DM (diabetes mellitus) ([place]) type II, insulin-dependent, uncontrolled -Hemoglobin A1c 8.3,  -Continue Lantus, sliding scale insulin     Hypertension associated with diabetes ([place]) -BP improving, continue hydralazine  -May restart Avapro once creatinine has normalized  carpal tunnel syndrome, left hand -Continue splint -Continue Neurontin to 200 mg twice a day -Left hand x-ray showed osteoarthritis  Right knee pain X-ray showed severe osteoarthritis  Acromioclavicular arthropathy Continue pain control, PT  Morbid obesity BMI 67.1, counseled on diet and weight control    Day of Discharge S: No complaints, appears close to her baseline, granddaughter at the bedside.  BP (!) 155/72 (BP Location: Left Arm)   Pulse 63   Temp (!) 97.5 F (36.4 C) (Oral)   Resp 20   Ht 5' (1.524 m)   Wt (!) 157.4 kg   SpO2 97%   BMI 67.77 kg/m   Physical Exam: General: Alert and awake oriented x3 not in  any acute distress. HEENT: anicteric sclera, pupils reactive to light and accommodation CVS: S1-S2 clear no murmur rubs or gallops Chest: clear to auscultation bilaterally, no wheezing rales or rhonchi Abdomen: Morbidly obese, soft nontender, nondistended, normal bowel sounds Extremities: no cyanosis, clubbing or edema noted bilaterally Neuro: Cranial nerves II-XII intact, no focal neurological deficits   The results of significant diagnostics from this hospitalization (including imaging, microbiology, ancillary and laboratory) are listed below for reference.      Procedures/Studies:  Ct Angio Head W/cm &/or Wo Cm  Result Date: 02/04/2018 CLINICAL DATA:  Facial weakness EXAM: CT ANGIOGRAPHY HEAD AND NECK TECHNIQUE: Multidetector CT imaging of the head and neck was performed using the standard protocol during bolus administration of intravenous contrast. Multiplanar CT image reconstructions and MIPs were obtained to evaluate the vascular anatomy. Carotid stenosis measurements (when applicable) are obtained utilizing NASCET criteria, using the distal internal carotid diameter as the denominator. CONTRAST:  60mL ISOVUE-370 IOPAMIDOL (ISOVUE-370) INJECTION 76% COMPARISON:  Head CT 02/03/2018 FINDINGS: CT HEAD FINDINGS Brain: There is no mass, hemorrhage or extra-axial collection. The size and configuration of the ventricles and extra-axial CSF spaces are normal. There is no acute or chronic infarction. There is hypoattenuation of the periventricular white matter, most commonly indicating chronic ischemic microangiopathy. Skull: The visualized skull base, calvarium and extracranial soft tissues are normal. Sinuses/Orbits: No fluid levels or advanced mucosal thickening of the visualized paranasal sinuses. No mastoid or middle ear effusion. The orbits are normal. CTA NECK FINDINGS SKELETON: There is no bony spinal canal stenosis. No lytic or blastic lesion. OTHER NECK: Normal pharynx, larynx and major  salivary glands. No cervical lymphadenopathy. Unremarkable thyroid gland. UPPER CHEST: No pneumothorax or pleural effusion. No nodules or masses. AORTIC ARCH: There is mild calcific atherosclerosis of the aortic arch. There is no aneurysm, dissection or hemodynamically significant stenosis of the visualized ascending aorta and aortic arch. Conventional 3 vessel aortic branching pattern. The visualized proximal subclavian arteries are widely patent. RIGHT CAROTID SYSTEM: --Common carotid artery: Widely patent origin without common carotid artery dissection or aneurysm. --Internal carotid artery: No dissection, occlusion or aneurysm. There is calcific atherosclerosis extending into the proximal ICA, resulting in less than 50% stenosis. Retropharyngeal course. --External carotid artery: No acute abnormality. LEFT CAROTID SYSTEM: --Common carotid artery: Widely patent origin without common carotid artery dissection or aneurysm. --Internal carotid artery: No dissection, occlusion  or aneurysm. There is calcific atherosclerosis extending into the proximal ICA, resulting in 50% stenosis. Retropharyngeal course. --External carotid artery: No acute abnormality. VERTEBRAL ARTERIES: Right dominant configuration. Limited visualization of the origins. No dissection, occlusion or flow-limiting stenosis to the vertebrobasilar confluence. CTA HEAD FINDINGS ANTERIOR CIRCULATION: --Intracranial internal carotid arteries: Atherosclerotic calcification of the internal carotid arteries at the skull base without hemodynamically significant stenosis. --Anterior cerebral arteries: Normal. Both A1 segments are present. Patent anterior communicating artery. --Middle cerebral arteries: Normal. --Posterior communicating arteries: Absent bilaterally. POSTERIOR CIRCULATION: --Basilar artery: Normal. --Posterior cerebral arteries: Normal. --Superior cerebellar arteries: Normal. --Inferior cerebellar arteries: Normal anterior and posterior inferior  cerebellar arteries. VENOUS SINUSES: As permitted by contrast timing, patent. ANATOMIC VARIANTS: None DELAYED PHASE: No parenchymal contrast enhancement. Review of the MIP images confirms the above findings. IMPRESSION: 1. No emergent large vessel occlusion or other acute abnormality. 2. 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 3.  Aortic atherosclerosis (ICD10-I70.0). Electronically Signed   By: Ulyses Jarred M.D.   On: 02/04/2018 01:19   Dg Chest 2 View  Result Date: 02/03/2018 CLINICAL DATA:  Diffuse body swelling and tachycardia. Wheezing for 2 months. EXAM: CHEST - 2 VIEW COMPARISON:  07/26/2015 chest radiograph FINDINGS: Cardiomegaly and pulmonary vascular congestion noted. There is no evidence of focal airspace disease, pulmonary edema, suspicious pulmonary nodule/mass, pleural effusion, or pneumothorax. No acute bony abnormalities are identified. IMPRESSION: Cardiomegaly with pulmonary vascular congestion. Electronically Signed   By: Margarette Canada M.D.   On: 02/03/2018 16:07   Dg Knee 1-2 Views Right  Result Date: 02/05/2018 CLINICAL DATA:  Swelling of the right knee for 1 month with pain, no acute abnormality. EXAM: RIGHT KNEE - 1-2 VIEW COMPARISON:  Right knee films of 01/04/2012 FINDINGS: There is little change in tricompartmental degenerative joint disease the right knee primarily involving the medial compartment where there is more loss of joint space and sclerosis present. No acute fracture is seen. There is a small amount of right knee joint fluid present. The patella appears normally positioned. IMPRESSION: 1. Tricompartmental degenerative joint disease the right knee primarily involving the medial compartment. 2. No acute fracture. 3. Small amount of fluid in the right knee joint space. Electronically Signed   By: Ivar Drape M.D.   On: 02/05/2018 11:47   Ct Head Wo Contrast  Result Date: 02/03/2018 CLINICAL DATA:  Weakness over the  last week. Facial droop beginning yesterday. EXAM: CT HEAD WITHOUT CONTRAST TECHNIQUE: Contiguous axial images were obtained from the base of the skull through the vertex without intravenous contrast. COMPARISON:  Brain MRI some 07/2012 FINDINGS: Brain: There is some area of cortical and subcortical low density in the insular region on the left that appears more prominent than was seen on previous studies. There could be recent infarction in that area. The remainder the brain shows atrophy and chronic small-vessel ischemic change of the white matter. No mass lesion, hemorrhage, hydrocephalus or extra-axial collection. Vascular: There is atherosclerotic calcification of the major vessels at the base of the brain. Skull: Negative Sinuses/Orbits: Clear/normal Other: None IMPRESSION: There is some low-density in the insular cortical and subcortical brain on the left that appears more prominent than on previous studies, raising the possibility of extension of an infarction in the left insular region. Evidence of hemorrhage or mass effect. Atrophy and chronic small-vessel ischemic changes elsewhere. Electronically Signed   By: Nelson Chimes M.D.   On: 02/03/2018 15:47   Ct Angio Neck W  Or Wo Contrast  Result Date: 02/04/2018 CLINICAL DATA:  Facial weakness EXAM: CT ANGIOGRAPHY HEAD AND NECK TECHNIQUE: Multidetector CT imaging of the head and neck was performed using the standard protocol during bolus administration of intravenous contrast. Multiplanar CT image reconstructions and MIPs were obtained to evaluate the vascular anatomy. Carotid stenosis measurements (when applicable) are obtained utilizing NASCET criteria, using the distal internal carotid diameter as the denominator. CONTRAST:  63mL ISOVUE-370 IOPAMIDOL (ISOVUE-370) INJECTION 76% COMPARISON:  Head CT 02/03/2018 FINDINGS: CT HEAD FINDINGS Brain: There is no mass, hemorrhage or extra-axial collection. The size and configuration of the ventricles and  extra-axial CSF spaces are normal. There is no acute or chronic infarction. There is hypoattenuation of the periventricular white matter, most commonly indicating chronic ischemic microangiopathy. Skull: The visualized skull base, calvarium and extracranial soft tissues are normal. Sinuses/Orbits: No fluid levels or advanced mucosal thickening of the visualized paranasal sinuses. No mastoid or middle ear effusion. The orbits are normal. CTA NECK FINDINGS SKELETON: There is no bony spinal canal stenosis. No lytic or blastic lesion. OTHER NECK: Normal pharynx, larynx and major salivary glands. No cervical lymphadenopathy. Unremarkable thyroid gland. UPPER CHEST: No pneumothorax or pleural effusion. No nodules or masses. AORTIC ARCH: There is mild calcific atherosclerosis of the aortic arch. There is no aneurysm, dissection or hemodynamically significant stenosis of the visualized ascending aorta and aortic arch. Conventional 3 vessel aortic branching pattern. The visualized proximal subclavian arteries are widely patent. RIGHT CAROTID SYSTEM: --Common carotid artery: Widely patent origin without common carotid artery dissection or aneurysm. --Internal carotid artery: No dissection, occlusion or aneurysm. There is calcific atherosclerosis extending into the proximal ICA, resulting in less than 50% stenosis. Retropharyngeal course. --External carotid artery: No acute abnormality. LEFT CAROTID SYSTEM: --Common carotid artery: Widely patent origin without common carotid artery dissection or aneurysm. --Internal carotid artery: No dissection, occlusion or aneurysm. There is calcific atherosclerosis extending into the proximal ICA, resulting in 50% stenosis. Retropharyngeal course. --External carotid artery: No acute abnormality. VERTEBRAL ARTERIES: Right dominant configuration. Limited visualization of the origins. No dissection, occlusion or flow-limiting stenosis to the vertebrobasilar confluence. CTA HEAD FINDINGS  ANTERIOR CIRCULATION: --Intracranial internal carotid arteries: Atherosclerotic calcification of the internal carotid arteries at the skull base without hemodynamically significant stenosis. --Anterior cerebral arteries: Normal. Both A1 segments are present. Patent anterior communicating artery. --Middle cerebral arteries: Normal. --Posterior communicating arteries: Absent bilaterally. POSTERIOR CIRCULATION: --Basilar artery: Normal. --Posterior cerebral arteries: Normal. --Superior cerebellar arteries: Normal. --Inferior cerebellar arteries: Normal anterior and posterior inferior cerebellar arteries. VENOUS SINUSES: As permitted by contrast timing, patent. ANATOMIC VARIANTS: None DELAYED PHASE: No parenchymal contrast enhancement. Review of the MIP images confirms the above findings. IMPRESSION: 1. No emergent large vessel occlusion or other acute abnormality. 2. 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 3.  Aortic atherosclerosis (ICD10-I70.0). Electronically Signed   By: Ulyses Jarred M.D.   On: 02/04/2018 01:19   Dg Hand 2 View Left  Result Date: 02/04/2018 CLINICAL DATA:  Hand pain and spasms EXAM: LEFT HAND - 2 VIEW COMPARISON:  Left wrist 01/04/2012 FINDINGS: Diffuse interphalangeal osteoarthritis with mild joint space narrowing and spurring diffusely. No erosion. Mild degenerative change in the radiocarpal joint. Negative for fracture. IMPRESSION: Osteoarthritis. Electronically Signed   By: Franchot Gallo M.D.   On: 02/04/2018 13:06       LAB RESULTS: Basic Metabolic Panel: Recent Labs  Lab 02/04/18 0347  02/06/18 1233 02/07/18 0546 02/10/18 0524  NA 138   < >  134* 138  --   K 3.7   < > 4.2 4.0  --   CL 102   < > 98 104  --   CO2 31   < > 29 28  --   GLUCOSE 241*   < > 167* 110*  --   BUN 18   < > 28* 27*  --   CREATININE 1.31*   < > 1.81* 1.51* 1.39*  CALCIUM 8.8*   < > 8.4* 7.9*  --   MG 1.7  --   --   --   --    < > = values  in this interval not displayed.   Liver Function Tests: Recent Labs  Lab 02/03/18 1515  AST 14*  ALT 14  ALKPHOS 65  BILITOT 0.5  PROT 7.3  ALBUMIN 3.2*   No results for input(s): LIPASE, AMYLASE in the last 168 hours. No results for input(s): AMMONIA in the last 168 hours. CBC: Recent Labs  Lab 02/04/18 0347 02/10/18 0524  WBC 7.6 7.0  HGB 11.6* 11.6*  HCT 36.4 37.2  MCV 82.4 84.0  PLT 193 200   Cardiac Enzymes: Recent Labs  Lab 02/03/18 1515  TROPONINI <0.03   BNP: Invalid input(s): POCBNP CBG: Recent Labs  Lab 02/09/18 2121 02/10/18 0617  GLUCAP 113* 108*      Disposition and Follow-up: Discharge Instructions    Diet Carb Modified   Complete by:  As directed    Diet Carb Modified   Complete by:  As directed    Increase activity slowly   Complete by:  As directed    Increase activity slowly   Complete by:  As directed        DISPOSITION: Skilled nursing facility   [place] information for follow-up providers    Marcial Pacas, MD. Schedule an appointment as soon as possible for a visit in 3 week(s).   Specialty:  Neurology Contact information: [place] [address] [phone]            Contact information for after-discharge care    [facility] .   Service:  Skilled Nursing Contact information: [place]                   Time coordinating discharge:  35 minutes  Signed:   Estill Cotta M.D. [facility] 02/10/2018, 10:41 AM Pager: [phone]

## 2018-02-10 NOTE — Clinical Social Work Placement (Signed)
Nurse to call report to [place]  NOTE  Date:  02/10/2018  Patient Details  Name: Bianca Barnett MRN: [id number] Date of Birth: [birth year]  Clinical Social Work is seeking post-discharge placement for this patient at the [place] level of care (*CSW will initial, date and re-position this form in  chart as items are completed):  Yes   Patient/family provided with [facility] Work Department's list of facilities offering this level of care within the geographic area requested by the patient (or if unable, by the patient's family).  Yes   Patient/family informed of their freedom to choose among providers that offer the needed level of care, that participate in Medicare, Medicaid or managed care program needed by the patient, have an available bed and are willing to accept the patient.  Yes   Patient/family informed of [facility]'s ownership interest in [facility] and [facility], as well as of the fact that they are under no obligation to receive care at these facilities.  PASRR submitted to EDS on       PASRR number received on       Existing PASRR number confirmed on       FL2 transmitted to all facilities in geographic area requested by pt/family on       FL2 transmitted to all facilities within larger geographic area on       Patient informed that his/her managed care company has contracts with or will negotiate with certain facilities, including the following:        Yes   Patient/family informed of bed offers received.  Patient chooses bed at [facility]     Physician recommends and patient chooses bed at      Patient to be transferred to [facility] on 02/10/18.  Patient to be transferred to facility by [facility]     Patient family notified on 02/10/18 of transfer.  Name of family member notified:        PHYSICIAN       Additional Comment:     _______________________________________________ Geralynn Ochs, LCSW 02/10/2018, 11:45 AM

## 2018-02-10 NOTE — Progress Notes (Signed)
RN called [facility] multiple times to give report. RN left a voicemail message for a call back number.

## 2018-02-10 NOTE — Progress Notes (Signed)
Report given to [facility]. All questions were answered.

## 2018-02-10 NOTE — Plan of Care (Signed)
POC and pain management reviewed with pt.

## 2018-04-10 ENCOUNTER — Other Ambulatory Visit: Payer: Self-pay | Admitting: Internal Medicine

## 2018-04-10 DIAGNOSIS — Z1231 Encounter for screening mammogram for malignant neoplasm of breast: Secondary | ICD-10-CM

## 2018-05-11 ENCOUNTER — Ambulatory Visit
Admission: RE | Admit: 2018-05-11 | Discharge: 2018-05-11 | Disposition: A | Discharge status: Home / Self Care | Payer: Medicare Other | Source: Ambulatory Visit | Attending: Internal Medicine | Admitting: Internal Medicine

## 2018-05-11 DIAGNOSIS — Z1231 Encounter for screening mammogram for malignant neoplasm of breast: Secondary | ICD-10-CM

## 2018-09-25 ENCOUNTER — Other Ambulatory Visit: Payer: Self-pay | Admitting: Internal Medicine

## 2018-09-25 DIAGNOSIS — Z20822 Contact with and (suspected) exposure to covid-19: Secondary | ICD-10-CM

## 2018-09-30 LAB — NOVEL CORONAVIRUS, NAA: SARS-CoV-2, NAA: NOT DETECTED

## 2018-10-02 ENCOUNTER — Emergency Department (HOSPITAL_COMMUNITY): Payer: Medicare Other

## 2018-10-02 ENCOUNTER — Emergency Department (HOSPITAL_COMMUNITY)
Admission: EM | Admit: 2018-10-02 | Discharge: 2018-10-02 | Disposition: A | Discharge status: Home / Self Care | Payer: Medicare Other | Attending: Emergency Medicine | Admitting: Emergency Medicine

## 2018-10-02 DIAGNOSIS — N189 Chronic kidney disease, unspecified: Secondary | ICD-10-CM | POA: Diagnosis not present

## 2018-10-02 DIAGNOSIS — E1122 Type 2 diabetes mellitus with diabetic chronic kidney disease: Secondary | ICD-10-CM | POA: Diagnosis not present

## 2018-10-02 DIAGNOSIS — I129 Hypertensive chronic kidney disease with stage 1 through stage 4 chronic kidney disease, or unspecified chronic kidney disease: Secondary | ICD-10-CM | POA: Insufficient documentation

## 2018-10-02 DIAGNOSIS — J45909 Unspecified asthma, uncomplicated: Secondary | ICD-10-CM | POA: Insufficient documentation

## 2018-10-02 DIAGNOSIS — Y9389 Activity, other specified: Secondary | ICD-10-CM | POA: Insufficient documentation

## 2018-10-02 DIAGNOSIS — Z87891 Personal history of nicotine dependence: Secondary | ICD-10-CM | POA: Insufficient documentation

## 2018-10-02 DIAGNOSIS — Z7982 Long term (current) use of aspirin: Secondary | ICD-10-CM | POA: Insufficient documentation

## 2018-10-02 DIAGNOSIS — N3090 Cystitis, unspecified without hematuria: Secondary | ICD-10-CM | POA: Diagnosis not present

## 2018-10-02 DIAGNOSIS — Y9289 Other specified places as the place of occurrence of the external cause: Secondary | ICD-10-CM | POA: Diagnosis not present

## 2018-10-02 DIAGNOSIS — Z79899 Other long term (current) drug therapy: Secondary | ICD-10-CM | POA: Insufficient documentation

## 2018-10-02 DIAGNOSIS — W19XXXA Unspecified fall, initial encounter: Secondary | ICD-10-CM

## 2018-10-02 DIAGNOSIS — S20212A Contusion of left front wall of thorax, initial encounter: Secondary | ICD-10-CM | POA: Diagnosis not present

## 2018-10-02 DIAGNOSIS — N390 Urinary tract infection, site not specified: Secondary | ICD-10-CM

## 2018-10-02 DIAGNOSIS — Y999 Unspecified external cause status: Secondary | ICD-10-CM | POA: Diagnosis not present

## 2018-10-02 DIAGNOSIS — Z794 Long term (current) use of insulin: Secondary | ICD-10-CM | POA: Insufficient documentation

## 2018-10-02 DIAGNOSIS — S299XXA Unspecified injury of thorax, initial encounter: Secondary | ICD-10-CM | POA: Diagnosis present

## 2018-10-02 LAB — CBC WITH DIFFERENTIAL/PLATELET
Abs Immature Granulocytes: 0.02 10*3/uL (ref 0.00–0.07)
Basophils Absolute: 0 10*3/uL (ref 0.0–0.1)
Basophils Relative: 0 %
Eosinophils Absolute: 0.1 10*3/uL (ref 0.0–0.5)
Eosinophils Relative: 1 %
HCT: 37.8 % (ref 36.0–46.0)
Hemoglobin: 11.9 g/dL — ABNORMAL LOW (ref 12.0–15.0)
Immature Granulocytes: 0 %
Lymphocytes Relative: 24 %
Lymphs Abs: 1.3 10*3/uL (ref 0.7–4.0)
MCH: 26.4 pg (ref 26.0–34.0)
MCHC: 31.5 g/dL (ref 30.0–36.0)
MCV: 83.8 fL (ref 80.0–100.0)
Monocytes Absolute: 0.7 10*3/uL (ref 0.1–1.0)
Monocytes Relative: 12 %
Neutro Abs: 3.2 10*3/uL (ref 1.7–7.7)
Neutrophils Relative %: 63 %
Platelets: 179 10*3/uL (ref 150–400)
RBC: 4.51 MIL/uL (ref 3.87–5.11)
RDW: 15.7 % — ABNORMAL HIGH (ref 11.5–15.5)
WBC: 5.3 10*3/uL (ref 4.0–10.5)
nRBC: 0 % (ref 0.0–0.2)

## 2018-10-02 LAB — URINALYSIS, ROUTINE W REFLEX MICROSCOPIC
Bilirubin Urine: NEGATIVE
Glucose, UA: NEGATIVE mg/dL
Ketones, ur: NEGATIVE mg/dL
Nitrite: NEGATIVE
Protein, ur: 100 mg/dL — AB
Specific Gravity, Urine: 1.008 (ref 1.005–1.030)
WBC, UA: >50 WBC/hpf — ABNORMAL HIGH (ref 0–5)
pH: 7 (ref 5.0–8.0)

## 2018-10-02 LAB — COMPREHENSIVE METABOLIC PANEL
ALT: 13 U/L (ref 0–44)
AST: 18 U/L (ref 15–41)
Albumin: 3.5 g/dL (ref 3.5–5.0)
Alkaline Phosphatase: 60 U/L (ref 38–126)
Anion gap: 10 (ref 5–15)
BUN: 22 mg/dL (ref 8–23)
CO2: 25 mmol/L (ref 22–32)
Calcium: 8.9 mg/dL (ref 8.9–10.3)
Chloride: 103 mmol/L (ref 98–111)
Creatinine, Ser: 1.23 mg/dL — ABNORMAL HIGH (ref 0.44–1.00)
GFR calc Af Amer: 49 mL/min — ABNORMAL LOW (ref >60)
GFR calc non Af Amer: 43 mL/min — ABNORMAL LOW (ref >60)
Glucose, Bld: 149 mg/dL — ABNORMAL HIGH (ref 70–99)
Potassium: 3.9 mmol/L (ref 3.5–5.1)
Sodium: 138 mmol/L (ref 135–145)
Total Bilirubin: 0.3 mg/dL (ref 0.3–1.2)
Total Protein: 7.5 g/dL (ref 6.5–8.1)

## 2018-10-02 MED ORDER — CEPHALEXIN 500 MG PO CAPS: 500.0000 mg | ORAL_CAPSULE | Freq: Two times a day (BID) | ORAL | 0 refills | Status: AC

## 2018-10-02 NOTE — Discharge Instructions (Addendum)
Drink plenty of fluids and follow up with your md next week °

## 2018-10-02 NOTE — ED Notes (Signed)
[facility] arrived, daughter of pt updated.
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
## 2018-10-02 NOTE — ED Notes (Signed)
purwick placed

## 2018-10-02 NOTE — ED Notes (Signed)
Patient transported to X-ray 

## 2018-10-02 NOTE — ED Notes (Signed)
Bed: WA06 Expected date:  Expected time:  Means of arrival:  Comments: EMS-flank pain

## 2018-10-02 NOTE — ED Notes (Signed)
[facility] called  
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
## 2018-10-03 LAB — URINE CULTURE

## 2018-10-31 DEATH — deceased

## 2020-06-21 IMAGING — CT CT ANGIO NECK
1 of 14 series · 5 of 46 positions shown, 10 images · IV contrast (OMNI)
Comparison: Head CT 02/03/2018

CLINICAL DATA: Facial weakness

EXAM:
CT ANGIOGRAPHY HEAD AND NECK
TECHNIQUE: Multidetector CT imaging of the head and neck was performed using
the standard protocol during bolus administration of intravenous
contrast. Multiplanar CT image reconstructions and MIPs were
obtained to evaluate the vascular anatomy. Carotid stenosis
measurements (when applicable) are obtained utilizing NASCET
criteria, using the distal internal carotid diameter as the
denominator.
CONTRAST:  75mL 215RZ8-QUF IOPAMIDOL (215RZ8-QUF) INJECTION 76%

[Series 8: carotid/brain 2.0 i30f 3 · axial · 0.54mm/px · z∈[-512,-280]mm · 5 of 176 slices shown, 10 images]
[im 30/176  soft-tissue]
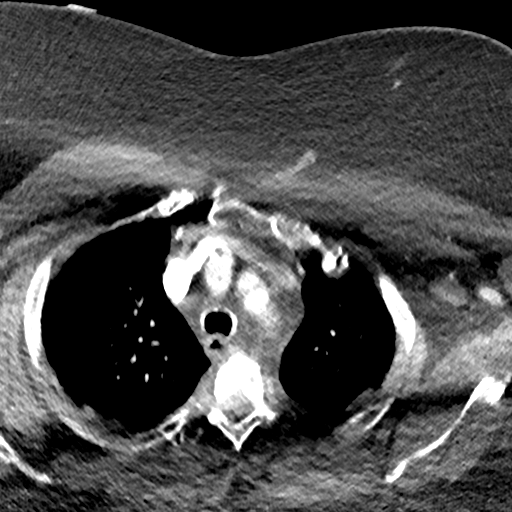
[im 30/176  bone]
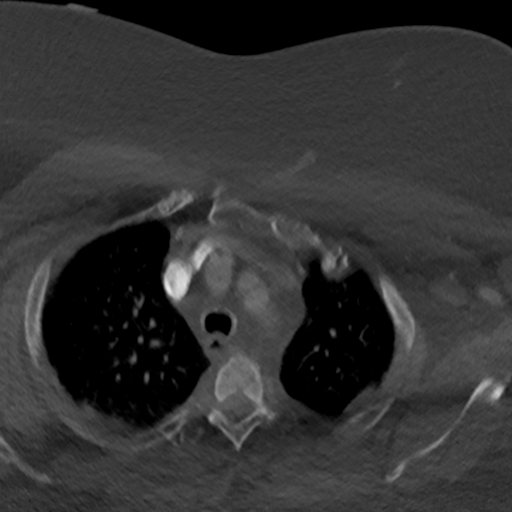
[im 59/176  soft-tissue]
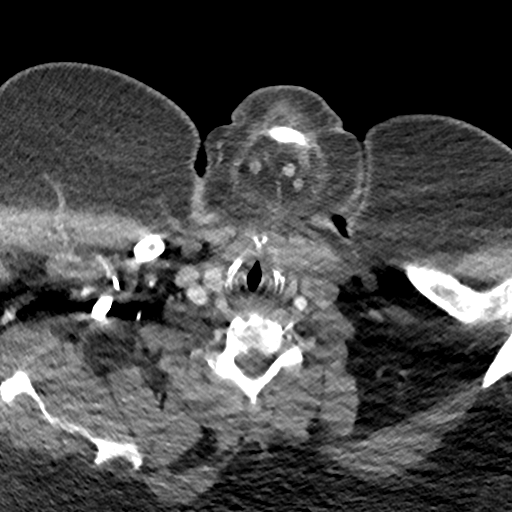
[im 59/176  lung]
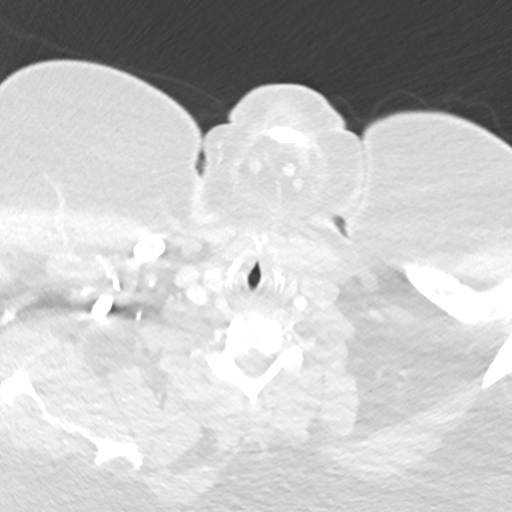
[im 88/176  soft-tissue]
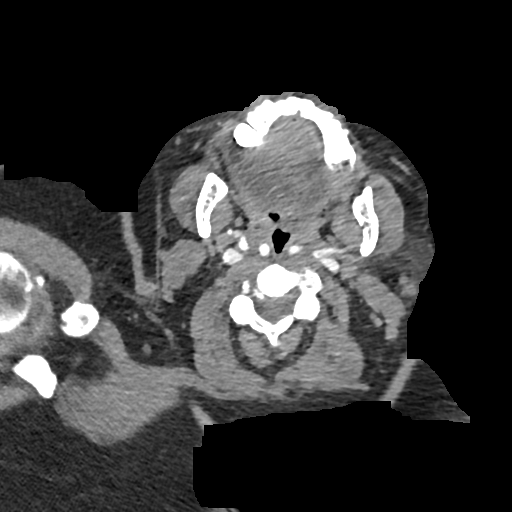
[im 88/176  lung]
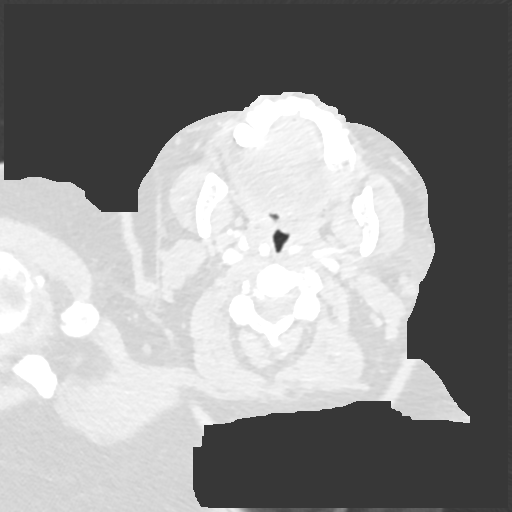
[im 117/176  soft-tissue]
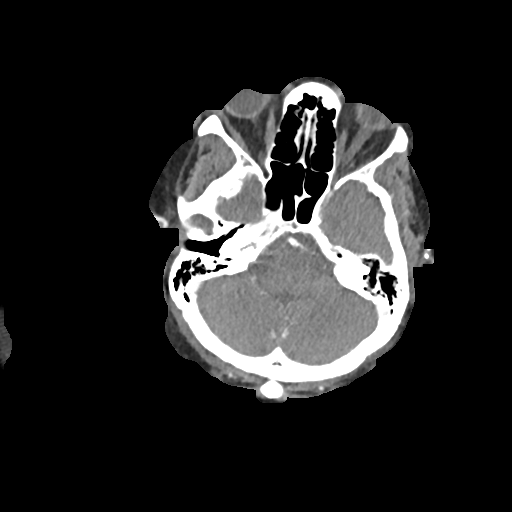
[im 117/176  lung]
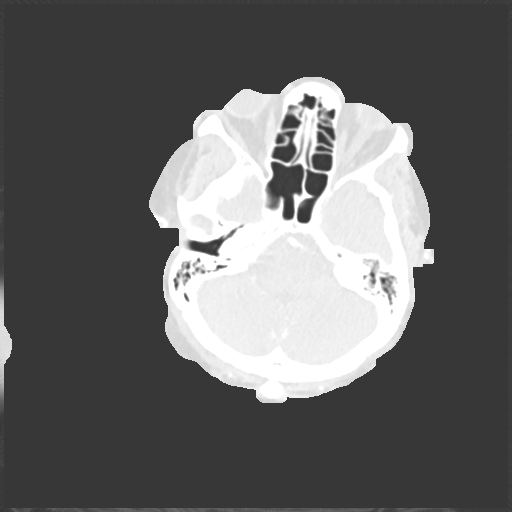
[im 146/176  soft-tissue]
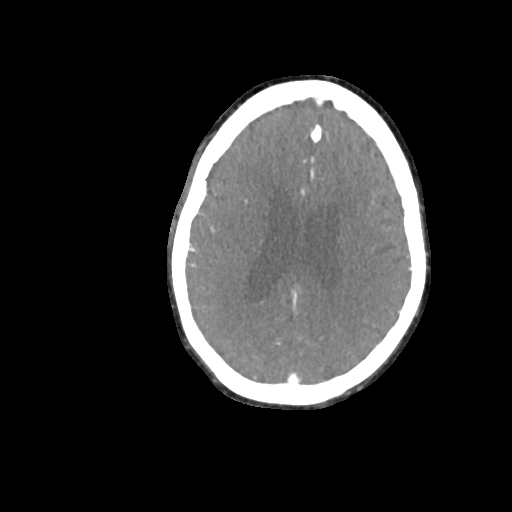
[im 146/176  lung]
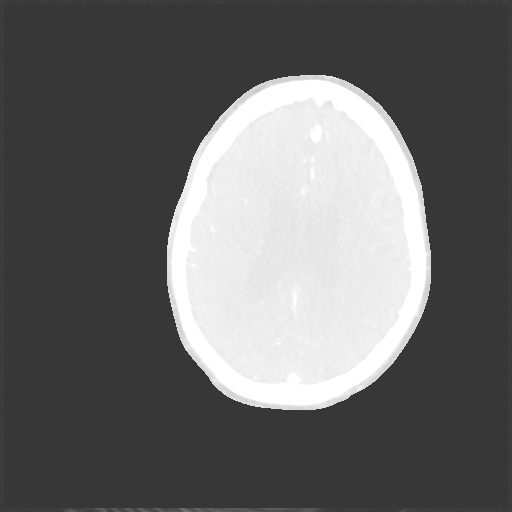

[5 of 46 positions shown; findings below may reference images not displayed]

FINDINGS: CT HEAD FINDINGS

Brain: There is no mass, hemorrhage or extra-axial collection. The
size and configuration of the ventricles and extra-axial CSF spaces
are normal. There is no acute or chronic infarction. There is
hypoattenuation of the periventricular white matter, most commonly
indicating chronic ischemic microangiopathy.

Skull: The visualized skull base, calvarium and extracranial soft
tissues are normal.

Sinuses/Orbits: No fluid levels or advanced mucosal thickening of
the visualized paranasal sinuses. No mastoid or middle ear effusion.
The orbits are normal.

CTA NECK FINDINGS

SKELETON: There is no bony spinal canal stenosis. No lytic or
blastic lesion.

OTHER NECK: Normal pharynx, larynx and major salivary glands. No
cervical lymphadenopathy. Unremarkable thyroid gland.

UPPER CHEST: No pneumothorax or pleural effusion. No nodules or
masses.

AORTIC ARCH: There is mild calcific atherosclerosis of the aortic
arch. There is no aneurysm, dissection or hemodynamically
significant stenosis of the visualized ascending aorta and aortic
arch. Conventional 3 vessel aortic branching pattern. The visualized
proximal subclavian arteries are widely patent.

RIGHT CAROTID SYSTEM:

--Common carotid artery: Widely patent origin without common carotid
artery dissection or aneurysm.

--Internal carotid artery: No dissection, occlusion or aneurysm.
There is calcific atherosclerosis extending into the proximal ICA,
resulting in less than 50% stenosis. Retropharyngeal course.

--External carotid artery: No acute abnormality.

LEFT CAROTID SYSTEM:

--Common carotid artery: Widely patent origin without common carotid
artery dissection or aneurysm.

--Internal carotid artery: No dissection, occlusion or aneurysm.
There is calcific atherosclerosis extending into the proximal ICA,
resulting in 50% stenosis. Retropharyngeal course.

--External carotid artery: No acute abnormality.

VERTEBRAL ARTERIES: Right dominant configuration. Limited
visualization of the origins. No dissection, occlusion or
flow-limiting stenosis to the vertebrobasilar confluence.

CTA HEAD FINDINGS

ANTERIOR CIRCULATION:

--Intracranial internal carotid arteries: Atherosclerotic
calcification of the internal carotid arteries at the skull base
without hemodynamically significant stenosis.

--Anterior cerebral arteries: Normal. Both A1 segments are present.
Patent anterior communicating artery.

--Middle cerebral arteries: Normal.

--Posterior communicating arteries: Absent bilaterally.

POSTERIOR CIRCULATION:

--Basilar artery: Normal.

--Posterior cerebral arteries: Normal.

--Superior cerebellar arteries: Normal.

--Inferior cerebellar arteries: Normal anterior and posterior
inferior cerebellar arteries.

VENOUS SINUSES: As permitted by contrast timing, patent.

ANATOMIC VARIANTS: None

DELAYED PHASE: No parenchymal contrast enhancement.

Review of the MIP images confirms the above findings.
IMPRESSION: 1. No emergent large vessel occlusion or other acute abnormality.
2. 50% stenosis of the proximal left internal carotid artery
secondary to calcific atherosclerosis.
3.  Aortic atherosclerosis ([id number]).
